# Patient Record
Sex: Male | Born: 1975 | Race: White | Hispanic: No | Marital: Married | Smoking: Never smoker
Health system: Southern US, Community
[De-identification: ages and names within clinical notes are randomized; demographics above are authoritative.]

## PROBLEM LIST (undated history)

## (undated) DIAGNOSIS — T7840XA Allergy, unspecified, initial encounter: Secondary | ICD-10-CM

## (undated) DIAGNOSIS — Z8619 Personal history of other infectious and parasitic diseases: Secondary | ICD-10-CM

## (undated) HISTORY — PX: SHOULDER SURGERY: SHX246

## (undated) HISTORY — DX: Allergy, unspecified, initial encounter: T78.40XA

## (undated) HISTORY — DX: Personal history of other infectious and parasitic diseases: Z86.19

---
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
## 2015-12-25 LAB — BASIC METABOLIC PANEL
BUN: 13 (ref 4–21)
Creatinine: 1.3 (ref 0.6–1.3)
Glucose: 77
POTASSIUM: 4 (ref 3.4–5.3)
Sodium: 139 (ref 137–147)

## 2015-12-25 LAB — TSH: TSH: 0.62 (ref 0.41–5.90)

## 2015-12-25 LAB — HEPATIC FUNCTION PANEL
ALK PHOS: 56 (ref 25–125)
ALT: 40 (ref 10–40)
AST: 26 (ref 14–40)
BILIRUBIN, TOTAL: 0.8

## 2015-12-25 LAB — CBC AND DIFFERENTIAL
HCT: 46 (ref 41–53)
HEMOGLOBIN: 15.2 (ref 13.5–17.5)
PLATELETS: 298 (ref 150–399)
WBC: 6.5

## 2015-12-25 LAB — HIV ANTIBODY (ROUTINE TESTING W REFLEX): HIV: NEGATIVE

## 2015-12-25 LAB — LIPID PANEL
CHOLESTEROL: 277 — AB (ref 0–200)
HDL: 57 (ref 35–70)
LDL Cholesterol: 182
Triglycerides: 188 — AB (ref 40–160)

## 2015-12-25 LAB — HEMOGLOBIN A1C: HEMOGLOBIN A1C: 5.4

## 2017-06-14 ENCOUNTER — Ambulatory Visit: Payer: Self-pay | Admitting: Family Medicine

## 2017-06-16 ENCOUNTER — Other Ambulatory Visit: Payer: Self-pay | Admitting: Orthopedic Surgery

## 2017-06-16 DIAGNOSIS — M25512 Pain in left shoulder: Secondary | ICD-10-CM

## 2017-06-16 DIAGNOSIS — R531 Weakness: Secondary | ICD-10-CM

## 2017-06-26 ENCOUNTER — Ambulatory Visit (INDEPENDENT_AMBULATORY_CARE_PROVIDER_SITE_OTHER): Payer: 59 | Admitting: Family Medicine

## 2017-06-26 ENCOUNTER — Encounter: Payer: Self-pay | Admitting: Family Medicine

## 2017-06-26 ENCOUNTER — Ambulatory Visit
Admission: RE | Admit: 2017-06-26 | Discharge: 2017-06-26 | Disposition: A | Discharge status: Home / Self Care | Payer: 59 | Source: Ambulatory Visit | Attending: Orthopedic Surgery | Admitting: Orthopedic Surgery

## 2017-06-26 VITALS — BP 126/78 | HR 87 | Temp 98.3°F | Ht 72.0 in | Wt 205.5 lb

## 2017-06-26 DIAGNOSIS — J309 Allergic rhinitis, unspecified: Secondary | ICD-10-CM

## 2017-06-26 DIAGNOSIS — Z23 Encounter for immunization: Secondary | ICD-10-CM

## 2017-06-26 DIAGNOSIS — Z8042 Family history of malignant neoplasm of prostate: Secondary | ICD-10-CM

## 2017-06-26 DIAGNOSIS — M25512 Pain in left shoulder: Secondary | ICD-10-CM

## 2017-06-26 DIAGNOSIS — R531 Weakness: Secondary | ICD-10-CM

## 2017-06-26 MED ORDER — LORATADINE-PSEUDOEPHEDRINE ER 10-240 MG PO TB24: 1.0000 | ORAL_TABLET | Freq: Every day | ORAL | 11 refills | Status: DC

## 2017-06-26 NOTE — Patient Instructions (Signed)
We will obtain records from her previous physician.  I sent in a prescription for your Claritin-D.  Please come back to see me in a few months for your annual physical.  Please also come back to see me if your cough flares up.  Take care, Dr. Jimmey RalphParker

## 2017-06-26 NOTE — Assessment & Plan Note (Signed)
Stable.  Continue Claritin-D.  Advised patient to follow-up when he has his flare up of asthma-like symptoms.  Will consider trial of albuterol and/or referral to pulmonology for PFTs.

## 2017-06-26 NOTE — Assessment & Plan Note (Signed)
Check PSA with next blood draw. 

## 2017-06-26 NOTE — Progress Notes (Signed)
Subjective:  Miguel MeresJeffrey Haberl is a 42 y.o. male who presents today with a chief complaint of allergic rhinitis and to establish care.   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  He has been living in [place] for approximately 5 years.  Previously was in [place], [place].  Originally from [place].  Allergic rhinitis, new problem Several year history.  Has been stable over the last several years since moving to [place].  Takes Claritin-D every day which helps with the symptoms.  He will occasionally get flareups 2-3 times yearly in which he has more asthma-like symptoms with some shortness of breath and cough.  Does not have either of those symptoms today.  He has never had lung function testing nor allergy testing in the past.  Symptoms are usually worse in the spring and fall.  No other obvious alleviating or aggravating factors.  ROS: Per HPI, otherwise a 10 point review of systems was performed and was negative  PMH:  The following were reviewed and entered/updated in epic: Past Medical History:  Diagnosis Date  . Allergy   . History of chicken pox    Patient Active Problem List   Diagnosis Date Noted  . Allergic rhinitis 06/26/2017  . Family history of prostate cancer 06/26/2017   Past Surgical History:  Procedure Laterality Date  . WISDOM TOOTH EXTRACTION  [id number]   Mother and MGM with ovarian cancer.  PGF with prostate cancer.   Medications- reviewed and updated Current Outpatient Medications  Medication Sig Dispense Refill  . loratadine-pseudoephedrine (CLARITIN-D 24 HOUR) 10-240 MG 24 hr tablet Take 1 tablet by mouth daily. 30 tablet 11   No current facility-administered medications for this visit.    Allergies-reviewed and updated No Known Allergies  Social History   Socioeconomic History  . Marital status: Married    Spouse name: None  . Number of children: 3  . Years of education: None  . Highest education level: None   Social Needs  . Financial resource strain: None  . Food insecurity - worry: None  . Food insecurity - inability: None  . Transportation needs - medical: None  . Transportation needs - non-medical: None  Occupational History  . None  Tobacco Use  . Smoking status: Never Smoker  . Smokeless tobacco: Never Used  Substance and Sexual Activity  . Alcohol use: Yes    Comment: 3-5 drinks wine an [place]bon  . Drug use: No  . Sexual activity: Yes  Other Topics Concern  . None  Social History Narrative  . None   Objective:  Physical Exam: BP 126/78 (BP Location: Left Arm, Patient Position: Sitting, Cuff Size: Large)   Pulse 87   Temp 98.3 F (36.8 C) (Oral)   Ht 6' (1.829 m)   Wt 205 lb 8 oz (93.2 kg)   SpO2 95%   BMI 27.87 kg/m   Gen: NAD, resting comfortably CV: RRR with no murmurs appreciated Pulm: NWOB, CTAB with no crackles, wheezes, or rhonchi GI: Normal bowel sounds present. Soft, Nontender, Nondistended. MSK: No edema, cyanosis, or clubbing noted Skin: Warm, dry Neuro: Grossly normal, moves all extremities Psych: Normal affect and thought content  Assessment/Plan:  Family history of prostate cancer Check PSA with next blood draw.  Allergic rhinitis Stable.  Continue Claritin-D.  Advised patient to follow-up when he has his flare up of asthma-like symptoms.  Will consider trial of albuterol and/or referral to pulmonology for PFTs.  Preventative healthcare Flu vaccine and tetanus  vaccine given today.  He will return soon for CPE.  Given family history, will need prostate cancer screening as well.   Katina Degree. Jimmey Ralph, MD 06/26/2017 11:01 AM

## 2017-07-13 ENCOUNTER — Encounter: Payer: Self-pay | Admitting: Family Medicine

## 2018-03-14 ENCOUNTER — Encounter: Payer: Self-pay | Admitting: Family Medicine

## 2018-03-14 ENCOUNTER — Ambulatory Visit (INDEPENDENT_AMBULATORY_CARE_PROVIDER_SITE_OTHER): Payer: 59 | Admitting: Family Medicine

## 2018-03-14 VITALS — BP 124/72 | HR 62 | Temp 98.0°F | Ht 72.0 in | Wt 196.6 lb

## 2018-03-14 DIAGNOSIS — R05 Cough: Secondary | ICD-10-CM | POA: Diagnosis not present

## 2018-03-14 DIAGNOSIS — R059 Cough, unspecified: Secondary | ICD-10-CM

## 2018-03-14 DIAGNOSIS — Z23 Encounter for immunization: Secondary | ICD-10-CM

## 2018-03-14 DIAGNOSIS — J342 Deviated nasal septum: Secondary | ICD-10-CM

## 2018-03-14 DIAGNOSIS — J309 Allergic rhinitis, unspecified: Secondary | ICD-10-CM | POA: Diagnosis not present

## 2018-03-14 MED ORDER — ALBUTEROL SULFATE HFA 108 (90 BASE) MCG/ACT IN AERS: 2.0000 | INHALATION_SPRAY | Freq: Four times a day (QID) | RESPIRATORY_TRACT | 2 refills | Status: DC | PRN

## 2018-03-14 MED ORDER — IPRATROPIUM BROMIDE 0.06 % NA SOLN: 2.0000 | Freq: Four times a day (QID) | NASAL | 12 refills | Status: DC

## 2018-03-14 MED ORDER — LORATADINE-PSEUDOEPHEDRINE ER 10-240 MG PO TB24: 1.0000 | ORAL_TABLET | Freq: Every day | ORAL | 3 refills | Status: DC

## 2018-03-14 NOTE — Assessment & Plan Note (Addendum)
Discussed treatment options including ENT referral.  No current symptoms.  Continue with watchful waiting.

## 2018-03-14 NOTE — Assessment & Plan Note (Signed)
Refilled Claritin today.  Start Atrovent nasal spray as well.  Likely contributing to his cough.

## 2018-03-14 NOTE — Patient Instructions (Addendum)
It was very nice to see you today!  I think your airways are reactive to allergens in the ear.  Start Atrovent nasal spray.  Please take this 3-4 times daily for runny nose and postnasal drip.  I will also send in a prescription for an albuterol inhaler.  Please take this as needed for wheezing or cough.  You can also use this about 30 minutes prior to starting any activity that makes her cough worse.  I will refill your Claritin today.  We will give your flu shot today.  Please let me know if your symptoms worsen or do not improve over the next 1-2 weeks.  Take care, Dr Jimmey Ralph

## 2018-03-14 NOTE — Progress Notes (Signed)
   Subjective:  Miguel Jordan is a 42 y.o. male who presents today for same-day appointment with a chief complaint of cough.   HPI:  Cough, Acute problem Started about 3 weeks ago. Stable over that time. No treatments tried.  He has a history of allergic rhinitis when he has inquired need for this.  Occasionally has shortness of breath.  Cough is nonproductive.  No wheezing.  He has had this recurrently over the last several years.  He has tried over-the-counter medications without significant improvement.  No chest pain.  He has been on Singulair in the past which did not help.  No heartburn or acid reflux.  He has been able to run and exercise normally.  No other obvious alleviating or aggravating factors.  ROS: Per HPI  PMH: He reports that he has never smoked. He has never used smokeless tobacco. He reports that he drinks alcohol. He reports that he does not use drugs.  Objective:  Physical Exam: BP 124/72 (BP Location: Left Arm, Patient Position: Sitting, Cuff Size: Normal)   Pulse 62   Temp 98 F (36.7 C) (Oral)   Ht 6' (1.829 m)   Wt 196 lb 9.6 oz (89.2 kg)   SpO2 96%   BMI 26.66 kg/m   Gen: NAD, resting comfortably HEENT: TMs with clear effusion bilaterally.  Oropharynx erythematous without exudate.  Nasal mucosa clear bilaterally with clear nasal discharge.  Leftward nasal septal deviation noted. CV: RRR with no murmurs appreciated Pulm: NWOB, CTAB with no crackles, wheezes, or rhonchi  Assessment/Plan:  Cough No red flag signs or symptoms.  Pulmonary exam negative.  Likely secondary to allergic rhinitis and reactive airway.  We will add Atrovent nasal spray to his current regimen.  Also start albuterol inhaler as he likely has a component of reactive airway.  Anticipate improvement as pollen season improves over the next few weeks.  Discussed reasons to return to care.  Follow-up as needed.  If symptoms persist or not improve over the next 2 weeks, would consider imaging  and/or referral to pulmonology for PFTs.  Nasal septal deviation Discussed treatment options including ENT referral.  No current symptoms.  Continue with watchful waiting.  Allergic rhinitis Refilled Claritin today.  Start Atrovent nasal spray as well.  Likely contributing to his cough.  Preventative Healthcare Flu shot declined.   Katina Degree. Jimmey Ralph, MD 03/14/2018 10:41 AM

## 2018-11-14 IMAGING — MR MR SHOULDER*L* W/O CM
4 of 7 series · 14 of 40 positions shown · non-contrast
Comparison: None.

CLINICAL DATA: Left shoulder pain for 7 months with limited range
of motion. No known injury.

EXAM:
MRI OF THE LEFT SHOULDER WITHOUT CONTRAST
TECHNIQUE: Multiplanar, multisequence MR imaging of the shoulder was performed.
No intravenous contrast was administered.

[Series 6: T2 fat-sat · axial · left · 3.0mm · 0.44mm/px · z∈[-53,+19]mm · 3 of 25 slices shown (1 of 2)]
[im 5/25]
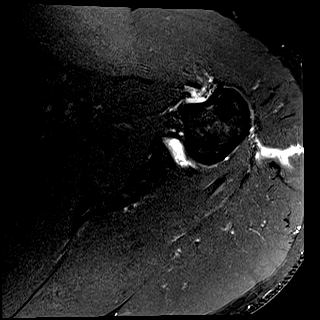
[im 15/25]
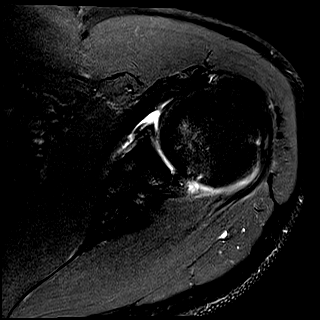
[im 25/25]
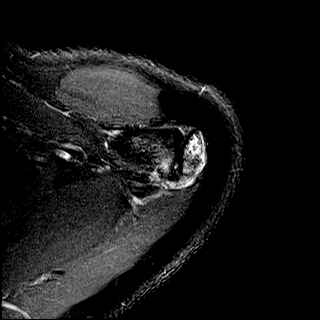

[Series 8: T2 fat-sat · oblique · left · 3.0mm · 0.36mm/px · 3 of 24 slices shown (2 of 2)]
[im 5/24]
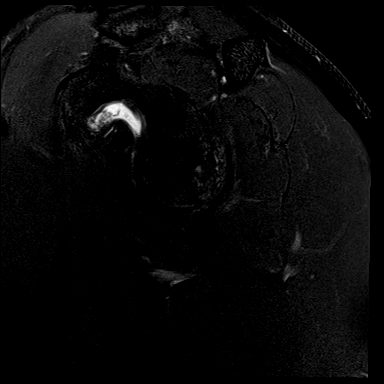
[im 14/24]
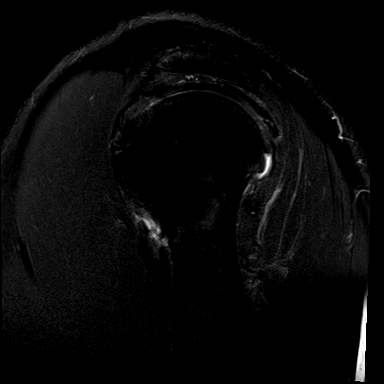
[im 24/24]
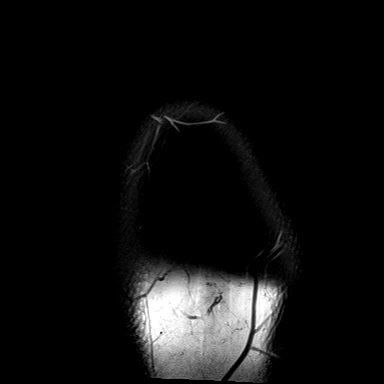

[Series 9: PD · oblique · left · 3.0mm · 0.18mm/px · 5 of 21 slices shown (1 of 2)]
[im 1/21]
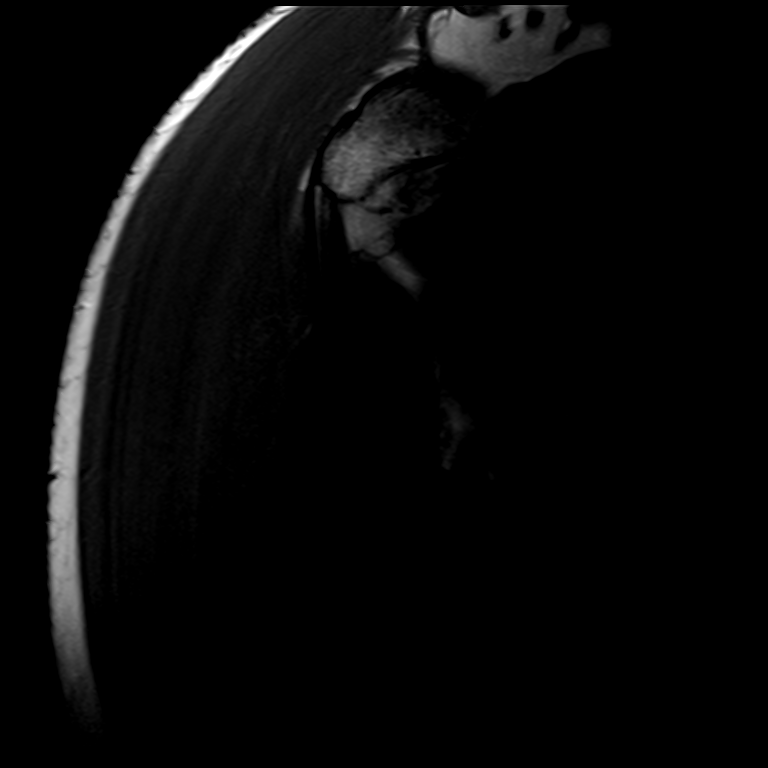
[im 6/21]
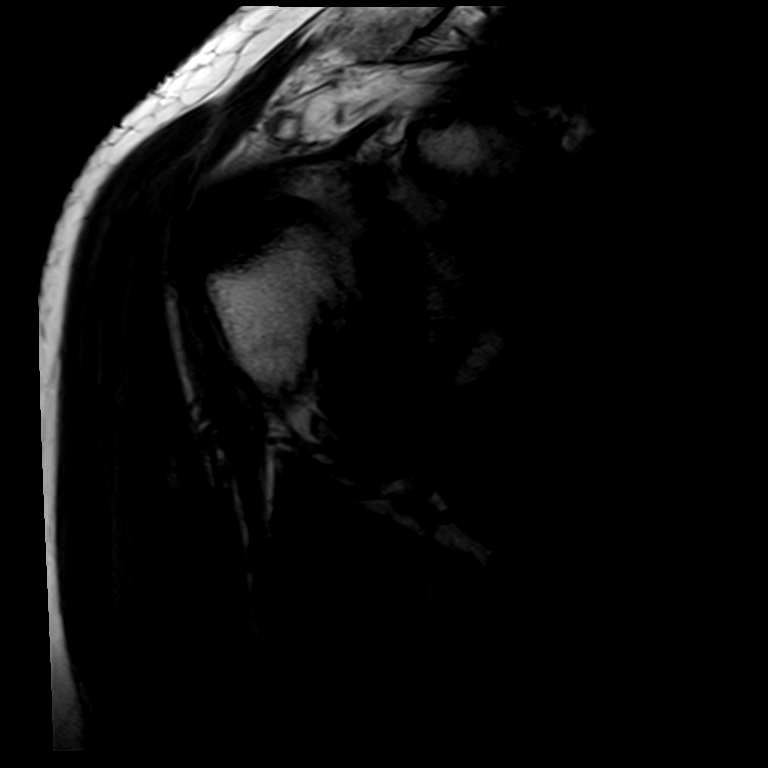
[im 11/21]
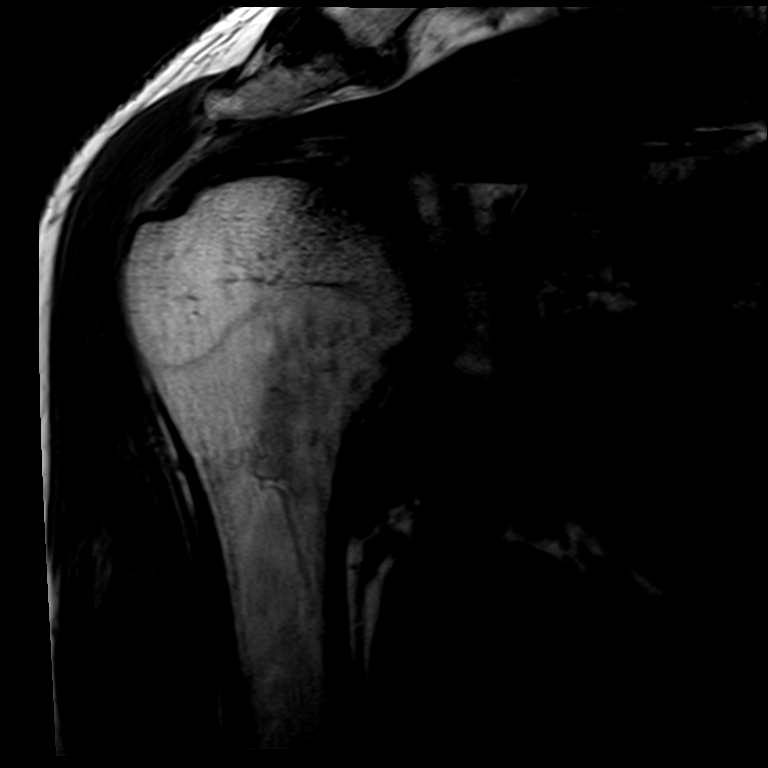
[im 16/21]
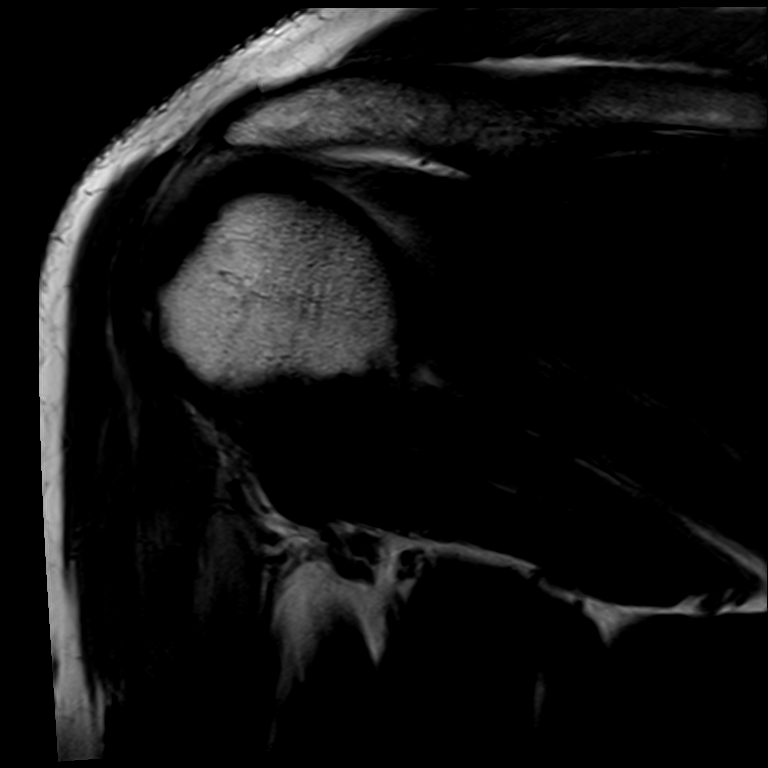
[im 21/21]
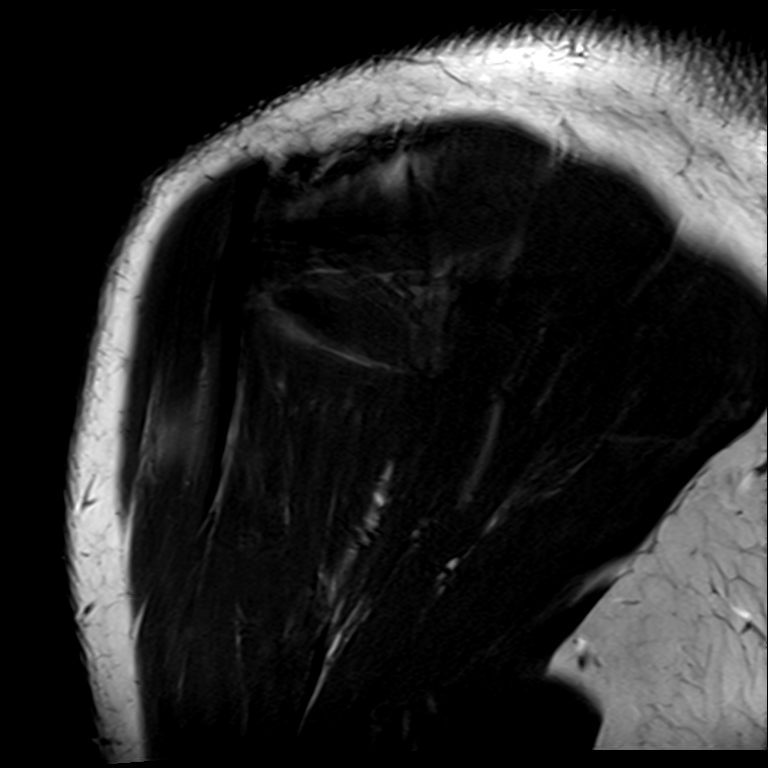

[Series 13: PD · axial · left · 3.0mm · 0.22mm/px · z∈[-53,+19]mm · 3 of 25 slices shown (2 of 2)]
[im 5/25]
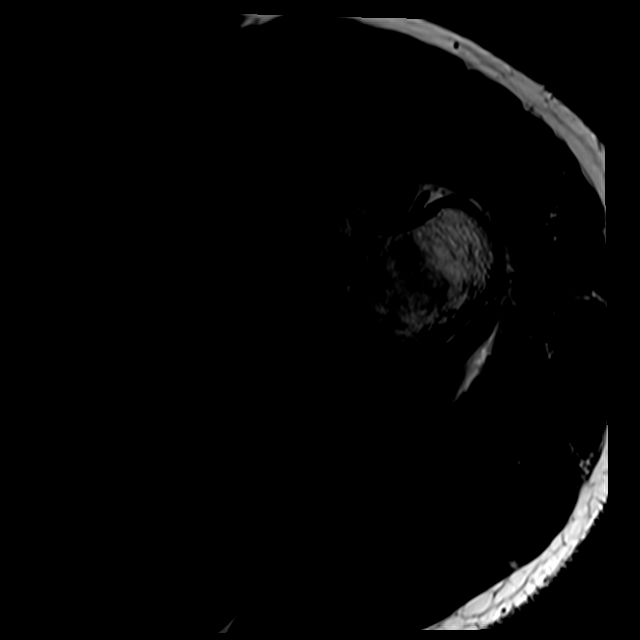
[im 15/25]
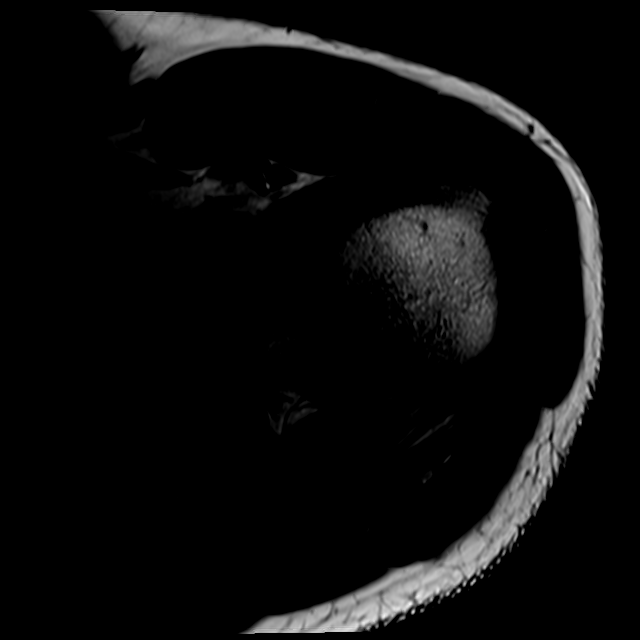
[im 25/25]
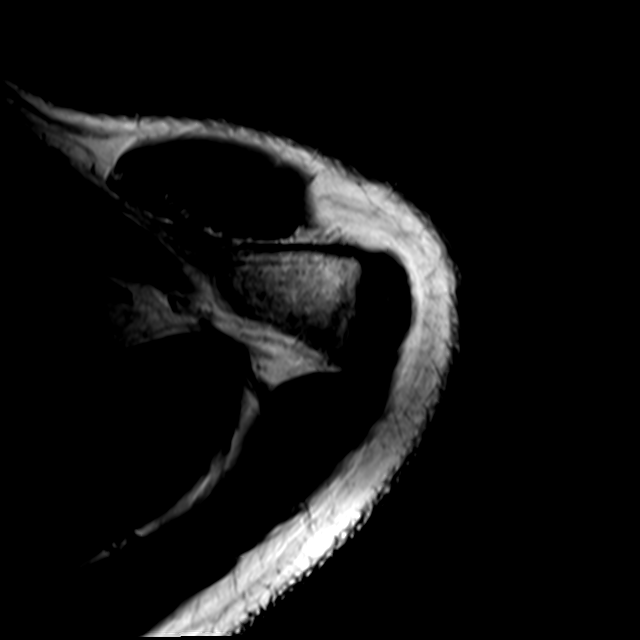

[14 of 40 positions shown; findings below may reference images not displayed]

FINDINGS: Rotator cuff: Mild appearing rotator cuff tendinopathy without tear
is seen.

Muscles:  Normal without atrophy or focal lesion.

Biceps long head: Intact. Tendinopathy of the intra-articular
segment is identified.

Acromioclavicular Joint: Bulky degenerative change is present with
marrow edema and subchondral cyst formation about the joint. Bony
hypertrophy results in some mass effect on the supraspinatus. Type 1
acromion. No subacromial/subdeltoid bursal fluid.

Glenohumeral Joint: Glenohumeral osteoarthritis appears advanced for
age. Mild subchondral edema and a few small cysts are seen in the
inferior and posterior glenoid. Cartilage is thinned throughout.
There is an osteophyte off the humeral head.

Labrum: A cyst is seen within the substance of the superior labrum
measuring 0.4 cm craniocaudal by 0.9 cm AP by 0.3 cm transverse.
There is tear of the posterior, superior labrum and remainder of the
posterior labrum is markedly degenerated and frayed.

Bones:  No fracture or worrisome lesion.

Other: None.
IMPRESSION: Advanced for age acromioclavicular osteoarthritis with marrow edema
and subchondral cyst formation about the joint. Bony hypertrophy
results in some mass effect on the supraspinatus.

Advanced for age glenohumeral osteoarthritis. A tear of the
posterior, superior labrum is identified. A degenerative cyst is
seen within the substance of the superior labrum.

Mild appearing rotator cuff tendinopathy without tear. Tendinopathy
of the intra-articular long head of biceps is also seen.

## 2018-12-19 ENCOUNTER — Ambulatory Visit (INDEPENDENT_AMBULATORY_CARE_PROVIDER_SITE_OTHER): Payer: 59 | Admitting: Family Medicine

## 2018-12-19 ENCOUNTER — Encounter: Payer: Self-pay | Admitting: Family Medicine

## 2018-12-19 ENCOUNTER — Other Ambulatory Visit: Payer: Self-pay

## 2018-12-19 VITALS — BP 128/80 | HR 75 | Temp 98.3°F | Resp 16 | Ht 72.0 in | Wt 195.4 lb

## 2018-12-19 DIAGNOSIS — J301 Allergic rhinitis due to pollen: Secondary | ICD-10-CM

## 2018-12-19 DIAGNOSIS — H6502 Acute serous otitis media, left ear: Secondary | ICD-10-CM

## 2018-12-19 DIAGNOSIS — H9312 Tinnitus, left ear: Secondary | ICD-10-CM

## 2018-12-19 MED ORDER — FLUTICASONE PROPIONATE 50 MCG/ACT NA SUSP: 1.0000 | Freq: Every day | NASAL | 6 refills | Status: DC

## 2018-12-19 NOTE — Patient Instructions (Signed)
Please return in 3-4 weeks with Dr. Jerline Pain for recheck.  Start flonase and change to zyrtec nightly.   If you have any questions or concerns, please don't hesitate to send me a message via MyChart or call the office at [phone]. Thank you for visiting with [place] today! It's our pleasure caring for you.   Tinnitus Tinnitus refers to hearing a sound when there is no actual source for that sound. This is often described as ringing in the ears. However, people with this condition may hear a variety of noises, in one ear or in both ears. The sounds of tinnitus can be soft, loud, or somewhere in between. Tinnitus can last for a few seconds or can be constant for days. It may go away without treatment and come back at various times. When tinnitus is constant or happens often, it can lead to other problems, such as trouble sleeping and trouble concentrating. Almost everyone experiences tinnitus at some point. Tinnitus that is long-lasting (chronic) or comes back often (recurs) may require medical attention. What are the causes? The cause of tinnitus is often not known. In some cases, it can result from other problems or conditions, including:  Exposure to loud noises from machinery, music, or other sources.  Hearing loss.  Ear or sinus infections.  Earwax buildup.  An object (foreign body) stuck in the ear.  Taking certain medicines.  Drinking alcohol or caffeine.  High blood pressure.  Heart diseases.  Anemia.  Allergies.  Meniere's disease.  Thyroid problems.  Tumors.  A weak, bulging blood vessel (aneurysm) near the ear.  Depression or other mood disorders. What are the signs or symptoms? The main symptom of tinnitus is hearing a sound when there is no source for that sound. It may sound like:  Buzzing.  Roaring.  Ringing.  Blowing air, like the sound heard when you listen to a seashell.  Hissing.  Whistling.  Sizzling.  Humming.  Running water.  A  musical note.  Tapping. Symptoms may affect only one ear (unilateral) or both ears (bilateral). How is this diagnosed? Tinnitus is diagnosed based on your symptoms, your medical history, and a physical exam. Your health care provider may do a thorough hearing test (audiologic exam) if your tinnitus:  Is unilateral.  Causes hearing difficulties.  Lasts 6 months or longer. You may work with a health care provider who specializes in hearing disorders (audiologist). You may be asked questions about your symptoms and how they affect your daily life. You may have other tests done, such as:  CT scan.  MRI.  An imaging test of how blood flows through your blood vessels (angiogram). How is this treated? Treating an underlying medical condition can sometimes make tinnitus go away. If your tinnitus continues, other treatments may include:  Medicines, such as antidepressants or sleeping aids.  Sound generators to mask the tinnitus. These include: ? Tabletop sound machines that play relaxing sounds to help you fall asleep. ? Wearable devices that fit in your ear and play sounds or music. ? Acoustic neural stimulation. This involves using headphones to listen to music that contains an auditory signal. Over time, listening to this signal may change some pathways in your brain and make you less sensitive to tinnitus. This treatment is used for very severe cases when no other treatment is working.  Therapy and counseling to help you manage the stress of living with tinnitus.  Using hearing aids or cochlear implants if your tinnitus is related to hearing loss.  Hearing aids are worn in the outer ear. Cochlear implants are surgically placed in the inner ear. Follow these instructions at home: Managing symptoms      When possible, avoid being in loud places and being exposed to loud sounds.  Wear hearing protection, such as earplugs, when you are exposed to loud noises.  Use a white noise  machine, a humidifier, or other devices to mask the sound of tinnitus.  Practice techniques for reducing stress, such as meditation, yoga, or deep breathing. Work with your health care provider if you need help with managing stress.  Sleep with your head slightly raised. This may reduce the impact of tinnitus. General instructions  Do not use stimulants, such as nicotine, alcohol, or caffeine. Talk with your health care provider about other stimulants to avoid. Stimulants are substances that can make you feel alert and attentive by increasing certain activities in the body (such as heart rate and blood pressure). These substances may make tinnitus worse.  Take over-the-counter and prescription medicines only as told by your health care provider.  Try to get plenty of sleep each night.  Keep all follow-up visits as told by your health care provider. This is important. Contact a health care provider if:  Your tinnitus continues for 3 weeks or longer without stopping.  Your symptoms get worse or do not get better with home care.  You develop tinnitus after a head injury.  You have tinnitus along with any of the following: ? Dizziness. ? Loss of balance. ? Nausea and vomiting. Summary  Tinnitus refers to hearing a sound when there is no actual source for that sound. This is often described as ringing in the ears.  Symptoms may affect only one ear (unilateral) or both ears (bilateral).  Use a white noise machine, a humidifier, or other devices to mask the sound of tinnitus.  Do not use stimulants, such as nicotine, alcohol, or caffeine. Talk with your health care provider about other stimulants to avoid. These substances may make tinnitus worse. This information is not intended to replace advice given to you by your health care provider. Make sure you discuss any questions you have with your health care provider. Document Released: 05/02/2005 Document Revised: 04/14/2017 Document  Reviewed: 02/09/2017 Elsevier Patient Education  2020 [employer]

## 2018-12-19 NOTE — Progress Notes (Signed)
Subjective  CC:  Chief Complaint  Patient presents with  . Tinnitus    Started 1 week ago in L ear.. States that it is constant, feeling pressure, and muffled hearing    HPI: Miguel Jordan is a 43 y.o. male who presents to the office today to address the problems listed above in the chief complaint.  Very pleasant and healthy 43 yo with SAR presents to left ear sxs: pressure and ringing. Started abruptly after running: went down to do pushups and pressure sensation and ringing started. Pressure now comes and goes, clears with valsalve, but ringing persists. Denies hearing loss or pain. No former trauma. First episode. Takes claritin d for allergies but admits sxs have been more active over the last month. No sinus pain, f/c/s. Feels well otherwise.    Assessment  1. Tinnitus, left   2. Acute serous otitis media of left ear, recurrence not specified   3. Seasonal allergic rhinitis due to pollen      Plan   Tinnitus, likely due to serous otitis due to active SAR:  Education given. Change to zyrtec, and add flonase. Recheck in 3-4 weeks if not improving.   Follow up: Return in about 3 weeks (around 01/09/2019) for recheck tinnitus with Dr. Jimmey RalphParker.  Visit date not found  No orders of the defined types were placed in this encounter.  Meds ordered this encounter  Medications  . fluticasone (FLONASE) 50 MCG/ACT nasal spray    Sig: Place 1 spray into both nostrils daily.    Dispense:  16 g    Refill:  6      I reviewed the patients updated PMH, FH, and SocHx.    Patient Active Problem List   Diagnosis Date Noted  . Nasal septal deviation 03/14/2018  . Allergic rhinitis 06/26/2017  . Family history of prostate cancer 06/26/2017   Current Meds  Medication Sig  . albuterol (PROVENTIL HFA;VENTOLIN HFA) 108 (90 Base) MCG/ACT inhaler Inhale 2 puffs into the lungs every 6 (six) hours as needed for wheezing or shortness of breath (cough).  Marland Kitchen. ipratropium (ATROVENT) 0.06 % nasal  spray Place 2 sprays into both nostrils 4 (four) times daily.  Marland Kitchen. loratadine-pseudoephedrine (CLARITIN-D 24 HOUR) 10-240 MG 24 hr tablet Take 1 tablet by mouth daily.    Allergies: Patient has No Known Allergies. Family History: Patient family history includes Prostate cancer in his paternal grandfather. Social History:  Patient  reports that he has never smoked. He has never used smokeless tobacco. He reports current alcohol use. He reports that he does not use drugs.  Review of Systems: Constitutional: Negative for fever malaise or anorexia Cardiovascular: negative for chest pain Respiratory: negative for SOB or persistent cough Gastrointestinal: negative for abdominal pain  Objective  Vitals: BP 128/80   Pulse 75   Temp 98.3 F (36.8 C) (Tympanic)   Resp 16   Ht 6' (1.829 m)   Wt 195 lb 6.4 oz (88.6 kg)   SpO2 95%   BMI 26.50 kg/m  General: no acute distress , A&Ox3 HEENT: PEERL, conjunctiva normal, Oropharynx moist,neck is supple, bilateral serous effusions, nl landmarks, no cerumen, nl EACs Cardiovascular:  RRR without murmur or gallop.  Respiratory:  Good breath sounds bilaterally, CTAB with normal respiratory effort Skin:  Warm, no rashes   Hearing Screening   Method: Audiometry   125Hz  250Hz  500Hz  1000Hz  2000Hz  3000Hz  4000Hz  6000Hz  8000Hz   Right ear:           Left ear:  Comments: Pass    Commons side effects, risks, benefits, and alternatives for medications and treatment plan prescribed today were discussed, and the patient expressed understanding of the given instructions. Patient is instructed to call or message via MyChart if he/she has any questions or concerns regarding our treatment plan. No barriers to understanding were identified. We discussed Red Flag symptoms and signs in detail. Patient expressed understanding regarding what to do in case of urgent or emergency type symptoms.   Medication list was reconciled, printed and provided to the  patient in AVS. Patient instructions and summary information was reviewed with the patient as documented in the AVS. This note was prepared with assistance of Dragon voice recognition software. Occasional wrong-word or sound-a-like substitutions may have occurred due to the inherent limitations of voice recognition software

## 2018-12-24 ENCOUNTER — Other Ambulatory Visit: Payer: Self-pay

## 2018-12-24 MED ORDER — CETIRIZINE HCL 10 MG PO TABS: 10.0000 mg | ORAL_TABLET | Freq: Every day | ORAL | 0 refills | Status: DC

## 2019-03-05 ENCOUNTER — Encounter: Payer: Self-pay | Admitting: Family Medicine

## 2019-03-05 ENCOUNTER — Ambulatory Visit (INDEPENDENT_AMBULATORY_CARE_PROVIDER_SITE_OTHER): Payer: BC Managed Care – PPO | Admitting: Family Medicine

## 2019-03-05 DIAGNOSIS — J301 Allergic rhinitis due to pollen: Secondary | ICD-10-CM

## 2019-03-05 DIAGNOSIS — J4520 Mild intermittent asthma, uncomplicated: Secondary | ICD-10-CM

## 2019-03-05 DIAGNOSIS — H9312 Tinnitus, left ear: Secondary | ICD-10-CM

## 2019-03-05 MED ORDER — PREDNISONE 10 MG PO TABS: ORAL_TABLET | ORAL | 0 refills | Status: DC

## 2019-03-05 MED ORDER — ALBUTEROL SULFATE HFA 108 (90 BASE) MCG/ACT IN AERS: 2.0000 | INHALATION_SPRAY | Freq: Four times a day (QID) | RESPIRATORY_TRACT | 2 refills | Status: DC | PRN

## 2019-03-05 NOTE — Progress Notes (Signed)
Virtual Visit via Video Note  Subjective  CC:  Chief Complaint  Patient presents with   Allergic Rhinitis     Started about 1 week ago.. Reports cough, left ear drainage and ringing, and congestion.. Has tried Zyrtec     I connected with Arvilla Meres on 03/05/19 at  8:20 AM EDT by a video enabled telemedicine application and verified that I am speaking with the correct person using two identifiers. Location patient: Home Location provider: [facility] [address], Office Persons participating in the virtual visit: Efton Thomley, Willow Ora, MD Rita Ohara, CMA  I discussed the limitations of evaluation and management by telemedicine and the availability of in person appointments. The patient expressed understanding and agreed to proceed. HPI: Miguel Jordan is a 43 y.o. male who was contacted today to address the problems listed above in the chief complaint.  43 yo with moderate allergies with annual "fall flare": has active allergies with nasal congestion and mild PND, and cough triggered by exhalation. Denies tightness in chest or SOB, productive cough, f/c/s, malaise, sinus pressure or pain but cough is bothersome. No h/o asthma or copd. Had similar presentation last fall: treated with albuterol MDI, atrovent nasal spray and allergy meds. Is on flonase and zyrtec now. Ran out of albuterol, feels it helps. Has not yet been to allergist or ENT. Of note, the tinnitus that started back in august persists: his "clogged" ear has resolved though. No ear pain.   Assessment  1. Allergic asthma, mild intermittent, uncomplicated   2. Seasonal allergic rhinitis due to pollen   3. Tinnitus, left      Plan   Allergic bronchospasm:  Refill albuterol and give short pred taper. Continue allergy meds.   Allergies: consider allergist referral if can't get better controlled.   Tinnitus: suspect due to allergies.  I discussed the assessment and treatment plan with  the patient. The patient was provided an opportunity to ask questions and all were answered. The patient agreed with the plan and demonstrated an understanding of the instructions.   The patient was advised to call back or seek an in-person evaluation if the symptoms worsen or if the condition fails to improve as anticipated. Follow up: No follow-ups on file.  Visit date not found  Meds ordered this encounter  Medications   albuterol (VENTOLIN HFA) 108 (90 Base) MCG/ACT inhaler    Sig: Inhale 2 puffs into the lungs every 6 (six) hours as needed for wheezing or shortness of breath (cough).    Dispense:  18 g    Refill:  2   predniSONE (DELTASONE) 10 MG tablet    Sig: Take 4 tabs qd x 2 days, 3 qd x 2 days, 2 qd x 2d, 1qd x 3 days    Dispense:  21 tablet    Refill:  0      I reviewed the patients updated PMH, FH, and SocHx.    Patient Active Problem List   Diagnosis Date Noted   Tinnitus, left 03/05/2019   Nasal septal deviation 03/14/2018   Allergic rhinitis 06/26/2017   Family history of prostate cancer 06/26/2017   Current Meds  Medication Sig   cetirizine (ZYRTEC) 10 MG tablet Take 1 tablet (10 mg total) by mouth daily.    Allergies: Patient has No Known Allergies. Family History: Patient family history includes Prostate cancer in his paternal grandfather. Social History:  Patient  reports that he has never smoked. He has  never used smokeless tobacco. He reports current alcohol use. He reports that he does not use drugs.  Review of Systems: Constitutional: Negative for fever malaise or anorexia Cardiovascular: negative for chest pain Respiratory: negative for SOB or persistent cough Gastrointestinal: negative for abdominal pain  OBJECTIVE Vitals: There were no vitals taken for this visit. General: no acute distress , A&Ox3, some coughing triggered with breaths. No respiratory distress Nasal congestion present. Appears well  Leamon Arnt, MD

## 2019-04-03 ENCOUNTER — Telehealth: Payer: Self-pay | Admitting: Family Medicine

## 2019-04-08 ENCOUNTER — Other Ambulatory Visit: Payer: Self-pay

## 2019-04-08 MED ORDER — FLUTICASONE PROPIONATE 50 MCG/ACT NA SUSP: 1.0000 | Freq: Every day | NASAL | 6 refills | Status: DC

## 2019-04-08 MED ORDER — CETIRIZINE HCL 10 MG PO TABS: 10.0000 mg | ORAL_TABLET | Freq: Every day | ORAL | 3 refills | Status: DC

## 2019-04-08 NOTE — Telephone Encounter (Signed)
Pt called in and stated the flonase is ok to refill and not this nasal spray.  He also would like to get the zyrtec refilled.   Pharmacy - [facility] in [place]

## 2019-04-08 NOTE — Telephone Encounter (Signed)
See note

## 2019-04-08 NOTE — Telephone Encounter (Signed)
Rx sent- patient aware.  

## 2019-05-24 ENCOUNTER — Encounter: Payer: Self-pay | Admitting: Family Medicine

## 2019-05-24 ENCOUNTER — Other Ambulatory Visit: Payer: Self-pay

## 2019-05-24 ENCOUNTER — Ambulatory Visit (INDEPENDENT_AMBULATORY_CARE_PROVIDER_SITE_OTHER): Payer: BC Managed Care – PPO | Admitting: Family Medicine

## 2019-05-24 VITALS — BP 110/70 | HR 72 | Temp 98.2°F | Ht 72.0 in | Wt 204.2 lb

## 2019-05-24 DIAGNOSIS — J301 Allergic rhinitis due to pollen: Secondary | ICD-10-CM

## 2019-05-24 DIAGNOSIS — H1010 Acute atopic conjunctivitis, unspecified eye: Secondary | ICD-10-CM | POA: Diagnosis not present

## 2019-05-24 MED ORDER — PREDNISONE 50 MG PO TABS: ORAL_TABLET | ORAL | 0 refills | Status: DC

## 2019-05-24 NOTE — Assessment & Plan Note (Addendum)
Worsened. With conjunctivitis and middle ear effusion today. No red flags. Will start prednisone burst.  He will continue zyrtex and flonase. Will place referral to allergy for further management/evalutation.   Recommended pataday for his conjunctivitis as well.

## 2019-05-24 NOTE — Patient Instructions (Signed)
It was very nice to see you today!  Please start the prednisone.  Try the over the counter pataday drops.  I will send in a referral for you to see the allergist.  Come back in the summer for your physical, or sooner if needed.   Take care, Dr Jimmey Ralph  Please try these tips to maintain a healthy lifestyle:   Eat at least 3 REAL meals and 1-2 snacks per day.  Aim for no more than 5 hours between eating.  If you eat breakfast, please do so within one hour of getting up.    Each meal should contain half fruits/vegetables, one quarter protein, and one quarter carbs (no bigger than a computer mouse)   Cut down on sweet beverages. This includes juice, soda, and sweet tea.     Drink at least 1 glass of water with each meal and aim for at least 8 glasses per day   Exercise at least 150 minutes every week.

## 2019-05-24 NOTE — Progress Notes (Signed)
   Miguel Jordan is a 44 y.o. male who presents today for an office visit.  Assessment/Plan:  Chronic Problems Addressed Today: Allergic rhinitis Worsened. With conjunctivitis and middle ear effusion today. No red flags. Will start prednisone burst.  He will continue zyrtex and flonase. Will place referral to allergy for further management/evalutation.   Recommended pataday for his conjunctivitis as well.      Subjective:  HPI:  Left Eye Swelling Started 2-3 days ago. Stable today, but was getting worse. Vision is normal. Feels like eye is swollen. Pain located under his eye.  Thinks he may be having a flareup of his allergies.  Is also having some persistent ringing in his left ear.  Hearing is normal.  Has had issues with allergies for several years.       Objective:  Physical Exam: BP 110/70   Pulse 72   Temp 98.2 F (36.8 C)   Ht 6' (1.829 m)   Wt 204 lb 4 oz (92.6 kg)   SpO2 98%   BMI 27.70 kg/m   Gen: No acute distress, resting comfortably HEENT: Bilateral TMs with clear effusion.  Left eye with normal extraocular eye movements.  Conjunctival erythema and watery discharge noted. CV: Regular rate and rhythm with no murmurs appreciated Pulm: Normal work of breathing, clear to auscultation bilaterally with no crackles, wheezes, or rhonchi Neuro: Grossly normal, moves all extremities Psych: Normal affect and thought content      Miguel Jordan M. Jimmey Ralph, MD 05/24/2019 3:29 PM

## 2019-05-24 NOTE — Progress Notes (Deleted)
Error

## 2019-06-21 ENCOUNTER — Encounter: Payer: Self-pay | Admitting: Allergy

## 2019-06-21 ENCOUNTER — Other Ambulatory Visit: Payer: Self-pay

## 2019-06-21 ENCOUNTER — Telehealth: Payer: Self-pay

## 2019-06-21 ENCOUNTER — Ambulatory Visit (INDEPENDENT_AMBULATORY_CARE_PROVIDER_SITE_OTHER): Payer: BC Managed Care – PPO | Admitting: Allergy

## 2019-06-21 VITALS — BP 124/90 | HR 60 | Temp 97.9°F | Resp 16 | Ht 72.5 in | Wt 203.8 lb

## 2019-06-21 DIAGNOSIS — J3089 Other allergic rhinitis: Secondary | ICD-10-CM

## 2019-06-21 DIAGNOSIS — R05 Cough: Secondary | ICD-10-CM

## 2019-06-21 DIAGNOSIS — H1013 Acute atopic conjunctivitis, bilateral: Secondary | ICD-10-CM

## 2019-06-21 DIAGNOSIS — R058 Other specified cough: Secondary | ICD-10-CM

## 2019-06-21 MED ORDER — MONTELUKAST SODIUM 10 MG PO TABS: ORAL_TABLET | ORAL | 5 refills | Status: DC

## 2019-06-21 MED ORDER — AZELASTINE-FLUTICASONE 137-50 MCG/ACT NA SUSP: NASAL | 5 refills | Status: DC

## 2019-06-21 MED ORDER — LEVOCETIRIZINE DIHYDROCHLORIDE 5 MG PO TABS: ORAL_TABLET | ORAL | 5 refills | Status: DC

## 2019-06-21 NOTE — Telephone Encounter (Signed)
Fax from [employer]:  Dymista not covered Pt has tried and failed Flonase alone   PA has been submitted through cover my meds Waiting approval or denial  Your information has been submitted to [employer]. [employer] will review the request and fax you a determination directly, typically within 3 business days of your submission once all necessary information is received.  If [employer] has not responded in 3 business days or if you have any questions about your submission, contact [employer] [place] at [phone].

## 2019-06-21 NOTE — Progress Notes (Signed)
New Patient Note  RE: Miguel Jordan MRN: [id number] DOB: 11-Oct-1975 Date of Office Visit: 06/21/2019  Referring provider: Vivi Barrack, MD Primary care provider: Vivi Barrack, MD  Chief Complaint: Allergies  History of present illness: Miguel Jordan is a 44 y.o. male presenting today for consultation for allergic rhinitis.  He reports symptoms of itchy eyes, nasal congestion/drainage, sneezing, ear fullness/pressure.   Fall is the worst season for him but symptoms are year-round.  He has been dealing with allergy symptoms "all of his of life".  He states during the fall he normally will also develop a cough that is a dry hacking cough and SOB.  He has been prescribed an albuterol inhaler that he uses during this time.  He also states he normally will end up on prednisone to help with the symptoms.  He saw his primary care Dr. Jerline Pain on 05/24/2019 for allergic rhinitis and was treated with a prednisone burst.  At this visit he had conjunctivitis as well as a middle ear effusion.  He has been recommended to take Zyrtec, Flonase and Pataday.   He states he alternates between Allegra and Zyrtec and he does feel both of them to be effective.  He states he has tried Claritin as well in the past.  He states he just got back on Flonase this past fall and uses 1 spray each nostril daily.  He states he did use Pataday but only used it about 1 or 2 times and he states he was using it to treat a stye.   He was on Singulair about 10 years ago when he was living in [place].  He states he has struggled with allergies and all the locations he has lived in throughout the [place] and [place].  He has never had any allergy testing.  No history of eczema or food allergy.    Review of systems: Review of Systems  Constitutional: Negative.   HENT: Positive for congestion, ear pain and tinnitus. Negative for ear discharge and nosebleeds.   Eyes: Negative.   Respiratory: Positive for cough.    Cardiovascular: Negative.   Gastrointestinal: Negative.   Musculoskeletal: Negative.   Skin: Negative.   Neurological: Negative.     All other systems negative unless noted above in HPI  Past medical history: Past Medical History:  Diagnosis Date  . Allergy   . History of chicken pox     Past surgical history: Past Surgical History:  Procedure Laterality Date  . SHOULDER SURGERY    . WISDOM TOOTH EXTRACTION  1993    Family history:  Family History  Problem Relation Age of Onset  . Prostate cancer Paternal Grandfather   . Colon cancer Neg Hx     Social history: He lives in a home with carpeting in the bedroom allergic heating and central cooling.  There is a dog in the home.  There is no concern for water damage, mildew or roaches in the home.  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  Denies a smoking history.  Medication List: Current Outpatient Medications  Medication Sig Dispense Refill  . albuterol (VENTOLIN HFA) 108 (90 Base) MCG/ACT inhaler Inhale 2 puffs into the lungs every 6 (six) hours as needed for wheezing or shortness of breath (cough). 18 g 2  . cetirizine (ZYRTEC) 10 MG tablet Take 1 tablet (10 mg total) by mouth daily. 90 tablet 3  . fluticasone (FLONASE) 50 MCG/ACT  nasal spray Place 1 spray into both nostrils daily. 16 g 6  . Azelastine-Fluticasone 137-50 MCG/ACT SUSP Use 1 spray in each nostril twice daily 23 g 5  . levocetirizine (XYZAL) 5 MG tablet Take 1 tablet by mouth once daily 30 tablet 5  . montelukast (SINGULAIR) 10 MG tablet Take 1 tablet by mouth once daily 30 tablet 5   No current facility-administered medications for this visit.    Known medication allergies: No Known Allergies   Physical examination: Blood pressure 124/90, pulse 60, temperature 97.9 F (36.6 C), temperature source Temporal, resp. rate 16, height 6' 0.5" (1.842 m), weight 203 lb 12.8 oz (92.4 kg), SpO2 95  %.  General: Alert, interactive, in no acute distress. HEENT: PERRLA, TMs pearly gray, turbinates moderately edematous without discharge, post-pharynx non erythematous. Neck: Supple without lymphadenopathy. Lungs: Clear to auscultation without wheezing, rhonchi or rales. {no increased work of breathing. CV: Normal S1, S2 without murmurs. Abdomen: Nondistended, nontender. Skin: Warm and dry, without lesions or rashes. Extremities:  No clubbing, cyanosis or edema. Neuro:   Grossly intact.  Diagnositics/Labs:  Allergy testing: Environmental allergy skin prick testing is positive to grass pollens pollens, tree pollens, molds, dust mites, cat, dog and tobacco leaf. Allergy testing results were read and interpreted by provider, documented by clinical staff.   Assessment and plan: Patient Instructions  Environmental allergies -Environmental allergy skin prick testing is positive to grasses, weeds, trees, mold, dust mites, cat, dog and tobacco leaf -Allergen avoidance measures discussed/handouts provided -Continue a long-acting antihistamine daily -recommend use of either Allegra 180 mg, Zyrtec 10 mg or Xyzal 5 mg -Restart Singulair 10 mg daily at bedtime.  Singulair is not an antihistamine but can work alongside your antihistamine to help with allergy and respiratory symptom control. If you notice any change in mood/behavior/sleep after starting Singulair then stop this medication and let us know.  Symptoms resolve after stopping the medication.   -Recommend using nasal saline rinse to help keep the nose flushed and clean.  Perform nasal saline rinses with distilled water or boiled water and bring down to room temperature before use.  Use your medicated nasal sprays after performing saline rinse.  Saline rinse kit provided today. -For control of nasal congestion and drainage recommend use of generic Dymista 1 spray each nostril twice a day.  This is a combination nasal spray with Flonase + Astelin  (nasal antihistamine).  Flonase helps with congestion in the Astelin helps with drainage control. -For itchy/watery/red eyes can use over-the-counter Pataday 1 drop each eye daily as needed -allergen immunotherapy discussed today including protocol, benefits and risk.  Informational handout provided.  If interested in this therapuetic option you can check with your insurance carrier for coverage.  Let us know if you would like to proceed with this option.    Allergic cough -Allergy symptom control above will help to control respiratory symptoms -have access to albuterol inhaler (i.e. Ventolin) 2 puffs every 4-6 hours as needed for cough/wheeze/shortness of breath/chest tightness.  May use 15-20 minutes prior to activity.   Monitor frequency of use.   -Singulair as above  Control goals:   Full participation in all desired activities (may need albuterol before activity)  Albuterol use two time or less a week on average (not counting use with activity)  Cough interfering with sleep two time or less a month  Oral steroids no more than once a year  No hospitalizations  Follow-up in 4 months or sooner if needed  I appreciate  the opportunity to take part in Miguel Jordan's care. Please do not hesitate to contact me with questions.  Sincerely,   Prudy Feeler, MD Allergy/Immunology [facility]

## 2019-06-21 NOTE — Patient Instructions (Addendum)
Environmental allergies -Environmental allergy skin prick testing is positive to grasses, weeds, trees, mold, dust mites, cat, dog and tobacco leaf -Allergen avoidance measures discussed/handouts provided -Continue a long-acting antihistamine daily -recommend use of either Allegra 180 mg, Zyrtec 10 mg or Xyzal 5 mg -Restart Singulair 10 mg daily at bedtime.  Singulair is not an antihistamine but can work alongside your antihistamine to help with allergy and respiratory symptom control. If you notice any change in mood/behavior/sleep after starting Singulair then stop this medication and let us know.  Symptoms resolve after stopping the medication.   -Recommend using nasal saline rinse to help keep the nose flushed and clean.  Perform nasal saline rinses with distilled water or boiled water and bring down to room temperature before use.  Use your medicated nasal sprays after performing saline rinse.  Saline rinse kit provided today. -For control of nasal congestion and drainage recommend use of generic Dymista 1 spray each nostril twice a day.  This is a combination nasal spray with Flonase + Astelin (nasal antihistamine).  Flonase helps with congestion in the Astelin helps with drainage control. -For itchy/watery/red eyes can use over-the-counter Pataday 1 drop each eye daily as needed -allergen immunotherapy discussed today including protocol, benefits and risk.  Informational handout provided.  If interested in this therapuetic option you can check with your insurance carrier for coverage.  Let us know if you would like to proceed with this option.    Allergic cough -Allergy symptom control above will help to control respiratory symptoms -have access to albuterol inhaler (i.e. Ventolin) 2 puffs every 4-6 hours as needed for cough/wheeze/shortness of breath/chest tightness.  May use 15-20 minutes prior to activity.   Monitor frequency of use.   -Singulair as above  Control goals:   Full  participation in all desired activities (may need albuterol before activity)  Albuterol use two time or less a week on average (not counting use with activity)  Cough interfering with sleep two time or less a month  Oral steroids no more than once a year  No hospitalizations  Follow-up in 4 months or sooner if needed 

## 2019-06-24 NOTE — Telephone Encounter (Signed)
Dymista has been denied by insurance. It states that Azelastine and Flunisolide are preferred. Please advise change in nasal spray.

## 2019-06-25 MED ORDER — FLUNISOLIDE 25 MCG/ACT (0.025%) NA SOLN: 2.0000 | Freq: Two times a day (BID) | NASAL | 5 refills | Status: DC

## 2019-06-25 MED ORDER — AZELASTINE HCL 0.15 % NA SOLN: 2.0000 | Freq: Two times a day (BID) | NASAL | 5 refills | Status: DC

## 2019-06-25 NOTE — Telephone Encounter (Signed)
Left a detail message regarding medication change from Dymista to Azelastine and Flunisolide medication. Patient can call back to discuss the changes or contact the pharmacy.

## 2019-06-25 NOTE — Addendum Note (Signed)
Addended by: Osa Craver on: 06/25/2019 09:37 AM   Modules accepted: Orders

## 2019-06-25 NOTE — Telephone Encounter (Signed)
Ok.  Those are both appropriate thus can go ahead and order the Azelastine 2 sprays each nostril twice a daily prn for drainage control and Flunisolide 2 sprays each nostril daily prn for congestion control

## 2019-10-21 ENCOUNTER — Ambulatory Visit (INDEPENDENT_AMBULATORY_CARE_PROVIDER_SITE_OTHER): Payer: BC Managed Care – PPO | Admitting: Family Medicine

## 2019-10-21 ENCOUNTER — Other Ambulatory Visit: Payer: Self-pay

## 2019-10-21 ENCOUNTER — Encounter: Payer: Self-pay | Admitting: Family Medicine

## 2019-10-21 VITALS — BP 122/78 | HR 92 | Temp 97.7°F | Ht 72.0 in | Wt 207.0 lb

## 2019-10-21 DIAGNOSIS — H9312 Tinnitus, left ear: Secondary | ICD-10-CM

## 2019-10-21 DIAGNOSIS — Z0001 Encounter for general adult medical examination with abnormal findings: Secondary | ICD-10-CM

## 2019-10-21 DIAGNOSIS — Z6828 Body mass index (BMI) 28.0-28.9, adult: Secondary | ICD-10-CM

## 2019-10-21 DIAGNOSIS — J3089 Other allergic rhinitis: Secondary | ICD-10-CM | POA: Diagnosis not present

## 2019-10-21 DIAGNOSIS — E785 Hyperlipidemia, unspecified: Secondary | ICD-10-CM

## 2019-10-21 DIAGNOSIS — Z8042 Family history of malignant neoplasm of prostate: Secondary | ICD-10-CM

## 2019-10-21 DIAGNOSIS — Z125 Encounter for screening for malignant neoplasm of prostate: Secondary | ICD-10-CM | POA: Diagnosis not present

## 2019-10-21 DIAGNOSIS — E663 Overweight: Secondary | ICD-10-CM

## 2019-10-21 LAB — LIPID PANEL
Cholesterol: 218 mg/dL — ABNORMAL HIGH (ref 0–200)
HDL: 56.3 mg/dL (ref >39.00)
LDL Cholesterol: 146 mg/dL — ABNORMAL HIGH (ref 0–99)
NonHDL: 161.68
Total CHOL/HDL Ratio: 4
Triglycerides: 79 mg/dL (ref 0.0–149.0)
VLDL: 15.8 mg/dL (ref 0.0–40.0)

## 2019-10-21 LAB — COMPREHENSIVE METABOLIC PANEL
ALT: 54 U/L — ABNORMAL HIGH (ref 0–53)
AST: 50 U/L — ABNORMAL HIGH (ref 0–37)
Albumin: 4.4 g/dL (ref 3.5–5.2)
Alkaline Phosphatase: 71 U/L (ref 39–117)
BUN: 14 mg/dL (ref 6–23)
CO2: 28 mEq/L (ref 19–32)
Calcium: 9.4 mg/dL (ref 8.4–10.5)
Chloride: 103 mEq/L (ref 96–112)
Creatinine, Ser: 1.04 mg/dL (ref 0.40–1.50)
GFR: 76.78 mL/min (ref >60.00)
Glucose, Bld: 93 mg/dL (ref 70–99)
Potassium: 4.8 mEq/L (ref 3.5–5.1)
Sodium: 137 mEq/L (ref 135–145)
Total Bilirubin: 0.5 mg/dL (ref 0.2–1.2)
Total Protein: 6.4 g/dL (ref 6.0–8.3)

## 2019-10-21 LAB — CBC
HCT: 41.2 % (ref 39.0–52.0)
Hemoglobin: 14 g/dL (ref 13.0–17.0)
MCHC: 33.9 g/dL (ref 30.0–36.0)
MCV: 86.7 fl (ref 78.0–100.0)
Platelets: 263 10*3/uL (ref 150.0–400.0)
RBC: 4.76 Mil/uL (ref 4.22–5.81)
RDW: 13.4 % (ref 11.5–15.5)
WBC: 4.4 10*3/uL (ref 4.0–10.5)

## 2019-10-21 LAB — TSH: TSH: 0.69 u[IU]/mL (ref 0.35–4.50)

## 2019-10-21 LAB — PSA: PSA: 0.52 ng/mL (ref 0.10–4.00)

## 2019-10-21 NOTE — Assessment & Plan Note (Signed)
Check PSA. ?

## 2019-10-21 NOTE — Progress Notes (Signed)
Chief Complaint:  Miguel Jordan is a 44 y.o. male who presents today for his annual comprehensive physical exam.    Assessment/Plan:  Chronic Problems Addressed Today: Allergic rhinitis With middle ear effusion today.  Continue medications per allergy.  Is looking into getting allergy shots.  Tinnitus, left Middle ear effusion today.  No other associated signs or symptoms.  No hearing loss or vertigo.  His effusion could be contributing to tinnitus.  Offered referral to ENT however he deferred for the time being.  Family history of prostate cancer Check PSA.    Body mass index is 28.07 kg/m. / Overweight BMI Metric Follow Up - 10/21/19 0900      BMI Metric Follow Up-Please document annually   BMI Metric Follow Up  Education provided       Preventative Healthcare: Check CBC, C met, TSH, lipid panel.  Check PSA due to family history.  Discussed colon cancer screening.  Patient Counseling(The following topics were reviewed and/or handout was given):  -Nutrition: Stressed importance of moderation in sodium/caffeine intake, saturated fat and cholesterol, caloric balance, sufficient intake of fresh fruits, vegetables, and fiber.  -Stressed the importance of regular exercise.   -Substance Abuse: Discussed cessation/primary prevention of tobacco, alcohol, or other drug use; driving or other dangerous activities under the influence; availability of treatment for abuse.   -Injury prevention: Discussed safety belts, safety helmets, smoke detector, smoking near bedding or upholstery.   -Sexuality: Discussed sexually transmitted diseases, partner selection, use of condoms, avoidance of unintended pregnancy and contraceptive alternatives.   -Dental health: Discussed importance of regular tooth brushing, flossing, and dental visits.  -Health maintenance and immunizations reviewed. Please refer to Health maintenance section.  Return to care in 1 year for next preventative visit.       Subjective:  HPI:  He has no acute complaints today.   Still has ongoing ringing in his left ear. Getting quiter. Ringing is persistent. No hearing loss. No vertigo.   Lifestyle Diet: Balanced. Plenty of fruits and vegetables.  Exercise: Going back into gym and going to weight room. Doing more cardio.   Depression screen [facility] 2/9 10/21/2019  Decreased Interest 0  Down, Depressed, Hopeless 0  PHQ - 2 Score 0   ROS: Per HPI, otherwise a complete review of systems was negative.   PMH:  The following were reviewed and entered/updated in epic: Past Medical History:  Diagnosis Date   Allergy    History of chicken pox    Patient Active Problem List   Diagnosis Date Noted   Dyslipidemia 10/21/2019   Tinnitus, left 03/05/2019   Nasal septal deviation 03/14/2018   Allergic rhinitis 06/26/2017   Family history of prostate cancer 06/26/2017   Past Surgical History:  Procedure Laterality Date   SHOULDER SURGERY     WISDOM TOOTH EXTRACTION  1993    Family History  Problem Relation Age of Onset   Prostate cancer Paternal Grandfather    Colon cancer Neg Hx     Medications- reviewed and updated Current Outpatient Medications  Medication Sig Dispense Refill   albuterol (VENTOLIN HFA) 108 (90 Base) MCG/ACT inhaler Inhale 2 puffs into the lungs every 6 (six) hours as needed for wheezing or shortness of breath (cough). 18 g 2   Azelastine HCl 0.15 % SOLN Place 2 sprays into both nostrils 2 (two) times daily. 30 mL 5   cetirizine (ZYRTEC) 10 MG tablet Take 1 tablet (10 mg total) by mouth daily. 90 tablet 3   flunisolide (  NASALIDE) 25 MCG/ACT (0.025%) SOLN Place 2 sprays into the nose 2 (two) times daily. 25 mL 5   fluticasone (FLONASE) 50 MCG/ACT nasal spray Place 1 spray into both nostrils daily. 16 g 6   levocetirizine (XYZAL) 5 MG tablet Take 1 tablet by mouth once daily 30 tablet 5   montelukast (SINGULAIR) 10 MG tablet Take 1 tablet by mouth once daily 30 tablet  5   No current facility-administered medications for this visit.    Allergies-reviewed and updated No Known Allergies  Social History   Socioeconomic History   Marital status: Married    Spouse name: Not on file   Number of children: 3   Years of education: Not on file   Highest education level: Not on file  Occupational History   Not on file  Tobacco Use   Smoking status: Never Smoker   Smokeless tobacco: Never Used  Substance and Sexual Activity   Alcohol use: Yes    Comment: 3-5 drinks wine an [place]   Drug use: No   Sexual activity: Yes  Other Topics Concern   Not on file  Social History Narrative   Not on file   Social Determinants of Health   Financial Resource Strain:    Difficulty of Paying Living Expenses:   Food Insecurity:    Worried About Charity fundraiser in the Last Year:    Arboriculturist in the Last Year:   Transportation Needs:    Film/video editor (Medical):    Lack of Transportation (Non-Medical):   Physical Activity:    Days of Exercise per Week:    Minutes of Exercise per Session:   Stress:    Feeling of Stress :   Social Connections:    Frequency of Communication with Friends and Family:    Frequency of Social Gatherings with Friends and Family:    Attends Religious Services:    Active Member of Clubs or Organizations:    Attends Music therapist:    Marital Status:         Objective:  Physical Exam: BP 122/78    Pulse 92    Temp 97.7 F (36.5 C)    Ht 6' (1.829 m)    Wt 207 lb (93.9 kg)    SpO2 95%    BMI 28.07 kg/m   Body mass index is 28.07 kg/m. Wt Readings from Last 3 Encounters:  10/21/19 207 lb (93.9 kg)  06/21/19 203 lb 12.8 oz (92.4 kg)  05/24/19 204 lb 4 oz (92.6 kg)   Gen: NAD, resting comfortably HEENT: TMs normal bilaterally. OP clear. No thyromegaly noted.  CV: RRR with no murmurs appreciated Pulm: NWOB, CTAB with no crackles, wheezes, or rhonchi GI: Normal bowel  sounds present. Soft, Nontender, Nondistended. MSK: no edema, cyanosis, or clubbing noted Skin: warm, dry Neuro: CN2-12 grossly intact. Strength 5/5 in upper and lower extremities. Reflexes symmetric and intact bilaterally.  Psych: Normal affect and thought content     Alizandra Loh M. Jerline Pain, MD 10/21/2019 9:00 AM

## 2019-10-21 NOTE — Assessment & Plan Note (Signed)
Middle ear effusion today.  No other associated signs or symptoms.  No hearing loss or vertigo.  His effusion could be contributing to tinnitus.  Offered referral to ENT however he deferred for the time being.

## 2019-10-21 NOTE — Patient Instructions (Signed)
It was very nice to see you today!  We will check blood work today.  Please let me know if you want a referral to ENT.  Please let me know if you would like to be referred for a colonoscopy or if you would like to have a cologuard kit sent to your house.   Take care, Dr Jerline Pain  Please try these tips to maintain a healthy lifestyle:   Eat at least 3 REAL meals and 1-2 snacks per day.  Aim for no more than 5 hours between eating.  If you eat breakfast, please do so within one hour of getting up.    Each meal should contain half fruits/vegetables, one quarter protein, and one quarter carbs (no bigger than a computer mouse)   Cut down on sweet beverages. This includes juice, soda, and sweet tea.     Drink at least 1 glass of water with each meal and aim for at least 8 glasses per day   Exercise at least 150 minutes every week.    Preventive Care 46-46 Years Old, Male Preventive care refers to lifestyle choices and visits with your health care provider that can promote health and wellness. This includes:  A yearly physical exam. This is also called an annual well check.  Regular dental and eye exams.  Immunizations.  Screening for certain conditions.  Healthy lifestyle choices, such as eating a healthy diet, getting regular exercise, not using drugs or products that contain nicotine and tobacco, and limiting alcohol use. What can I expect for my preventive care visit? Physical exam Your health care provider will check:  Height and weight. These may be used to calculate body mass index (BMI), which is a measurement that tells if you are at a healthy weight.  Heart rate and blood pressure.  Your skin for abnormal spots. Counseling Your health care provider may ask you questions about:  Alcohol, tobacco, and drug use.  Emotional well-being.  Home and relationship well-being.  Sexual activity.  Eating habits.  Work and work Statistician. What immunizations do I  need?  Influenza (flu) vaccine  This is recommended every year. Tetanus, diphtheria, and pertussis (Tdap) vaccine  You may need a Td booster every 10 years. Varicella (chickenpox) vaccine  You may need this vaccine if you have not already been vaccinated. Zoster (shingles) vaccine  You may need this after age 45. Measles, mumps, and rubella (MMR) vaccine  You may need at least one dose of MMR if you were born in 1957 or later. You may also need a second dose. Pneumococcal conjugate (PCV13) vaccine  You may need this if you have certain conditions and were not previously vaccinated. Pneumococcal polysaccharide (PPSV23) vaccine  You may need one or two doses if you smoke cigarettes or if you have certain conditions. Meningococcal conjugate (MenACWY) vaccine  You may need this if you have certain conditions. Hepatitis A vaccine  You may need this if you have certain conditions or if you travel or work in places where you may be exposed to hepatitis A. Hepatitis B vaccine  You may need this if you have certain conditions or if you travel or work in places where you may be exposed to hepatitis B. Haemophilus influenzae type b (Hib) vaccine  You may need this if you have certain risk factors. Human papillomavirus (HPV) vaccine  If recommended by your health care provider, you may need three doses over 6 months. You may receive vaccines as individual doses or as  more than one vaccine together in one shot (combination vaccines). Talk with your health care provider about the risks and benefits of combination vaccines. What tests do I need? Blood tests  Lipid and cholesterol levels. These may be checked every 5 years, or more frequently if you are over 39 years old.  Hepatitis C test.  Hepatitis B test. Screening  Lung cancer screening. You may have this screening every year starting at age 40 if you have a 30-pack-year history of smoking and currently smoke or have quit within  the past 15 years.  Prostate cancer screening. Recommendations will vary depending on your family history and other risks.  Colorectal cancer screening. All adults should have this screening starting at age 27 and continuing until age 39. Your health care provider may recommend screening at age 39 if you are at increased risk. You will have tests every 1-10 years, depending on your results and the type of screening test.  Diabetes screening. This is done by checking your blood sugar (glucose) after you have not eaten for a while (fasting). You may have this done every 1-3 years.  Sexually transmitted disease (STD) testing. Follow these instructions at home: Eating and drinking  Eat a diet that includes fresh fruits and vegetables, whole grains, lean protein, and low-fat dairy products.  Take vitamin and mineral supplements as recommended by your health care provider.  Do not drink alcohol if your health care provider tells you not to drink.  If you drink alcohol: ? Limit how much you have to 0-2 drinks a day. ? Be aware of how much alcohol is in your drink. In the [place] one drink equals one 12 oz bottle of beer (355 mL), one 5 oz glass of wine (148 mL), or one 1 oz glass of hard liquor (44 mL). Lifestyle  Take daily care of your teeth and gums.  Stay active. Exercise for at least 30 minutes on 5 or more days each week.  Do not use any products that contain nicotine or tobacco, such as cigarettes, e-cigarettes, and chewing tobacco. If you need help quitting, ask your health care provider.  If you are sexually active, practice safe sex. Use a condom or other form of protection to prevent STIs (sexually transmitted infections).  Talk with your health care provider about taking a low-dose aspirin every day starting at age 66. What's next?  Go to your health care provider once a year for a well check visit.  Ask your health care provider how often you should have your eyes and teeth  checked.  Stay up to date on all vaccines. This information is not intended to replace advice given to you by your health care provider. Make sure you discuss any questions you have with your health care provider. Document Revised: 04/26/2018 Document Reviewed: 04/26/2018 Elsevier Patient Education  2020 [place] Screening  Colorectal cancer screening is a group of tests that are used to check for colorectal cancer before symptoms develop. Colorectal refers to the colon and rectum. The colon and rectum are located at the end of the digestive tract and carry bowel movements out of the body. Who should have screening? All adults starting at age 29 until age 1 should have screening. Your health care provider may recommend screening at age 39. You will have tests every 1-10 years, depending on your results and the type of screening test. You may have screening tests starting at an earlier age, or more frequently  than other people, if you have any of the following risk factors:  A personal or family history of colorectal cancer or abnormal growths (polyps).  Inflammatory bowel disease, such as ulcerative colitis or Crohn's disease.  A history of having radiation treatment to the abdomen or pelvic area for cancer.  Colorectal cancer symptoms, such as changes in bowel habits or blood in your stool.  A type of colon cancer syndrome that is passed from parent to child (hereditary), such as: ? Lynch syndrome. ? Familial adenomatous polyposis. ? Turcot syndrome. ? Peutz-Jeghers syndrome. Screening recommendations for adults who are 38-34 years old vary depending on health. How is screening done? There are several types of colorectal screening tests. You may have one or more of the following:  Guaiac-based fecal occult blood testing. For this test, a stool (feces) sample is checked for hidden (occult) blood, which could be a sign of colorectal cancer.  Fecal  immunochemical test (FIT). For this test, a stool sample is checked for blood, which could be a sign of colorectal cancer.  Stool DNA test. For this test, a stool sample is checked for blood and changes in DNA that could lead to colorectal cancer.  Sigmoidoscopy. During this test, a thin, flexible tube with a camera on the end (sigmoidoscope) is used to examine the rectum and the lower colon.  Colonoscopy. During this test, a long, flexible tube with a camera on the end (colonoscope) is used to examine the entire colon and rectum. With a colonoscopy, it is possible to take a sample of tissue (biopsy) and remove small polyps during the test.  Virtual colonoscopy. Instead of a colonoscope, this type of colonoscopy uses X-rays (CT scan) and computers to produce images of the colon and rectum. What are the benefits of screening? Screening reduces your risk for colorectal cancer and can help identify cancer at an early stage, when the cancer can be removed or treated more easily. It is common for polyps to form in the lining of the colon, especially as you age. These polyps may be cancerous or become cancerous over time. Screening can identify these polyps. What are the risks of screening? Each screening test may have different risks.  Stool sample tests have fewer risks than other types of screening tests. However, you may need more tests to confirm results from a stool sample test.  Screening tests that involve X-rays expose you to low levels of radiation, which may slightly increase your cancer risk. The benefit of detecting cancer outweighs the slight increase in risk.  Screening tests such as sigmoidoscopy and colonoscopy may place you at risk for bleeding, intestinal damage, infection, or a reaction to medicines given during the exam. Talk with your health care provider to understand your risk for colorectal cancer and to make a screening plan that is right for you. Questions to ask your health  care provider  When should I start colorectal cancer screening?  What is my risk for colorectal cancer?  How often do I need screening?  Which screening tests do I need?  How do I get my test results?  What do my results mean? Where to find more information Learn more about colorectal cancer screening from:  The [place]: www.cancer.org  The [employer]: www.cancer.gov Summary  Colorectal cancer screening is a group of tests used to check for colorectal cancer before symptoms develop.  Screening reduces your risk for colorectal cancer and can help identify cancer at an early stage, when  the cancer can be removed or treated more easily.  All adults starting at age 23 until age 24 should have screening. Your health care provider may recommend screening at age 85.  You may have screening tests starting at an earlier age, or more frequently than other people, if you have certain risk factors.  Talk with your health care provider to understand your risk for colorectal cancer and to make a screening plan that is right for you. This information is not intended to replace advice given to you by your health care provider. Make sure you discuss any questions you have with your health care provider. Document Revised: 08/22/2018 Document Reviewed: 02/01/2017 Elsevier Patient Education  [place].

## 2019-10-21 NOTE — Assessment & Plan Note (Signed)
With middle ear effusion today.  Continue medications per allergy.  Is looking into getting allergy shots.

## 2019-10-22 ENCOUNTER — Other Ambulatory Visit: Payer: Self-pay | Admitting: *Deleted

## 2019-10-22 DIAGNOSIS — R7401 Elevation of levels of liver transaminase levels: Secondary | ICD-10-CM

## 2019-10-22 NOTE — Progress Notes (Signed)
Please inform patient of the following:  Liver numbers up slightly but cholesterol is much better.  Everything else is NORMAL. Would like for him to come back in a week or so to recheck liver numbers. Please place future order for CMET.  Katina Degree. Jimmey Ralph, MD 10/22/2019 2:24 PM

## 2019-11-05 ENCOUNTER — Other Ambulatory Visit (INDEPENDENT_AMBULATORY_CARE_PROVIDER_SITE_OTHER): Payer: BC Managed Care – PPO

## 2019-11-05 ENCOUNTER — Other Ambulatory Visit: Payer: Self-pay

## 2019-11-05 DIAGNOSIS — R7401 Elevation of levels of liver transaminase levels: Secondary | ICD-10-CM | POA: Diagnosis not present

## 2019-11-05 LAB — COMPREHENSIVE METABOLIC PANEL
ALT: 41 U/L (ref 0–53)
AST: 28 U/L (ref 0–37)
Albumin: 4.5 g/dL (ref 3.5–5.2)
Alkaline Phosphatase: 65 U/L (ref 39–117)
BUN: 15 mg/dL (ref 6–23)
CO2: 28 mEq/L (ref 19–32)
Calcium: 9.5 mg/dL (ref 8.4–10.5)
Chloride: 104 mEq/L (ref 96–112)
Creatinine, Ser: 1.02 mg/dL (ref 0.40–1.50)
GFR: 78.51 mL/min (ref >60.00)
Glucose, Bld: 88 mg/dL (ref 70–99)
Potassium: 4.2 mEq/L (ref 3.5–5.1)
Sodium: 139 mEq/L (ref 135–145)
Total Bilirubin: 0.6 mg/dL (ref 0.2–1.2)
Total Protein: 6.5 g/dL (ref 6.0–8.3)

## 2019-11-05 NOTE — Progress Notes (Signed)
Please inform patient of the following:  Liver numbers are back to baseline. Do not need to do any further testing at this point. We can recheck again in a year.

## 2019-11-08 ENCOUNTER — Ambulatory Visit: Payer: BC Managed Care – PPO | Admitting: Family Medicine

## 2019-12-16 ENCOUNTER — Other Ambulatory Visit: Payer: Self-pay | Admitting: Allergy

## 2020-02-03 DIAGNOSIS — Z20828 Contact with and (suspected) exposure to other viral communicable diseases: Secondary | ICD-10-CM | POA: Diagnosis not present

## 2020-02-05 ENCOUNTER — Telehealth: Payer: Self-pay

## 2020-02-05 NOTE — Telephone Encounter (Signed)
Pt.'s wife has called. Pt has tested positive for covid yesterday with an at home test. He tested at [facility] on Monday with a PCR test, but hasn't received results.  Pt is on day 4 of symptoms. He is experiencing body aches, fever, headache, cough, occasional dizziness, fever, and lightheadedness. Pt has had  severe asthma his whole life. They are concerned since he is high risk and want to know what else can be done for his care.

## 2020-02-05 NOTE — Telephone Encounter (Signed)
Please schedule a virtual visit for pt to discuss.

## 2020-02-06 ENCOUNTER — Encounter: Payer: Self-pay | Admitting: Family Medicine

## 2020-02-06 ENCOUNTER — Telehealth: Payer: Self-pay | Admitting: Family Medicine

## 2020-02-06 ENCOUNTER — Telehealth (INDEPENDENT_AMBULATORY_CARE_PROVIDER_SITE_OTHER): Payer: BC Managed Care – PPO | Admitting: Family Medicine

## 2020-02-06 ENCOUNTER — Telehealth: Payer: BC Managed Care – PPO | Admitting: Family Medicine

## 2020-02-06 VITALS — Temp 100.5°F | Ht 72.0 in | Wt 207.0 lb

## 2020-02-06 DIAGNOSIS — U071 COVID-19: Secondary | ICD-10-CM | POA: Diagnosis not present

## 2020-02-06 DIAGNOSIS — J45909 Unspecified asthma, uncomplicated: Secondary | ICD-10-CM

## 2020-02-06 MED ORDER — IVERMECTIN 3 MG PO TABS: 300.0000 ug/kg | ORAL_TABLET | Freq: Every day | ORAL | 0 refills | Status: DC

## 2020-02-06 MED ORDER — BUDESONIDE 1 MG/2ML IN SUSP
1.0000 mg | Freq: Every day | RESPIRATORY_TRACT | 1 refills | Status: DC
Start: 2020-02-06 — End: 2021-03-11

## 2020-02-06 MED ORDER — PREDNISONE 20 MG PO TABS
40.0000 mg | ORAL_TABLET | Freq: Every day | ORAL | 0 refills | Status: DC
Start: 2020-02-06 — End: 2021-03-11

## 2020-02-06 MED ORDER — AZITHROMYCIN 250 MG PO TABS: ORAL_TABLET | ORAL | 0 refills | Status: DC

## 2020-02-06 NOTE — Telephone Encounter (Signed)
Initial Comment Caller states that her husband has tested positive for COVID. He has a cough, started on day two. He has hx asthma, he has a wheezing cough. He did get light-headed. His fingertips were pale. She is asking for the COVID infusion for him. Translation No Nurse Assessment Nurse: Garald Braver, RN, Thayer Ohm Date/Time Lamount Cohen Time): 02/05/2020 2:19:22 PM Confirm and document reason for call. If symptomatic, describe symptoms. ---Caller states that her husband has tested positive for COVID. He has a cough, started on day two. He has hx asthma, he has a wheezing cough. He did get light-headed. His fingertips were pale. Does the patient have any new or worsening symptoms? ---Yes Will a triage be completed? ---Yes Related visit to physician within the last 2 weeks? ---No Does the PT have any chronic conditions? (i.e. diabetes, asthma, this includes High risk factors for pregnancy, etc.) ---Yes List chronic conditions. ---asthma Is this a behavioral health or substance abuse call? ---No Guidelines Guideline Title Affirmed Question Affirmed Notes Nurse Date/Time (Eastern Time) COVID-19 - Diagnosed or Suspected MILD difficulty breathing (e.g., minimal/no SOB at rest, SOB with walking, pulse <100) Garald Braver, RN, Thayer Ohm 02/05/2020 2:20:00 PM Disp. Time Lamount Cohen Time) Disposition Final User 02/05/2020 2:15:51 PM Send to Urgent Cherrie Distance, ShelbePLEASE NOTE: All timestamps contained within this report are represented as [place] Standard Time. CONFIDENTIALTY NOTICE: This fax transmission is intended only for the addressee. It contains information that is legally privileged, confidential or otherwise protected from use or disclosure. If you are not the intended recipient, you are strictly prohibited from reviewing, disclosing, copying using or disseminating any of this information or taking any action in reliance on or regarding this information. If you have received this fax in error, please  notify us immediately by telephone so that we can arrange for its return to [place]. Phone: [phone], Toll-Free: [phone], Fax: [phone] Page: 2 of 2 Call Id: [id number] 02/05/2020 2:30:52 PM Go to ED Now (or PCP triage) Yes Garald Braver, RN, Joya Salm Disagree/Comply Disagree Caller Understands Yes PreDisposition Call Doctor Care Advice Given Per Guideline * IF NO PCP (PRIMARY CARE PROVIDER) SECOND-LEVEL TRIAGE: You need to be seen within the next hour. Go to the ED/UCC at _____________ Hospital. Leave as soon as you can. GO TO ED NOW (OR PCP TRIAGE): CARE ADVICE given per COVID-19 - DIAGNOSED OR SUSPECTED (Adult) guideline. Referrals GO TO FACILITY REFUSED Warm transfer to backlin

## 2020-02-06 NOTE — Telephone Encounter (Signed)
Pt had virtual visit with Dr. Artis Flock.

## 2020-02-06 NOTE — Progress Notes (Signed)
Patient: Miguel Jordan MRN: [id number] DOB: [birth year] PCP: Ardith Dark, MD     I connected with Arvilla Meres on 02/06/20 at 2:42pm  by a video enabled telemedicine application and verified that I am speaking with the correct person using two identifiers.  Location patient: Home Location provider: [facility], Office Persons participating in this virtual visit: Juliane Poot, Dr. Artis Flock  I discussed the limitations of evaluation and management by telemedicine and the availability of in person appointments. The patient expressed understanding and agreed to proceed.   Subjective:  Chief Complaint  Patient presents with  . Covid Positive    Symptoms started Sunday 19th    HPI: The patient is a 44 y.o. male who presents today for Positive Covid Test. He is on day 5 of symptoms. He says that he woke up early Sunday morning with a severe headache behind the eyes. His fever was at 100.0 that day with, fatigue,and chills. Symptoms have progressed and the passed 2 days worsened. He has tried Tylenol that does help. It brought down recent fever. He complains of constant coughing.  And has no taste or smell. He got his positive covid test back yesterday. He states today has been rough. He is tired and has constant coughing that is getting worse. He states he is short of breath, but he has no chest tightness. If he tries to inhale he will start to cough. Reminds him of the fall asthma he gets. He has been quarantining since Sunday. Pulse ox has been "good" he says per wife. Around no sick contacts, unsure where he could have gotten this from.   Review of Systems  Constitutional: Positive for chills, fatigue and fever.  HENT: Negative for congestion and sore throat.   Respiratory: Positive for cough. Negative for shortness of breath.   Cardiovascular: Negative for chest pain and palpitations.  Gastrointestinal: Negative for abdominal pain, diarrhea, nausea and vomiting.  Musculoskeletal:  Positive for arthralgias and myalgias.  Neurological: Positive for dizziness. Negative for headaches.    Allergies Patient has No Known Allergies.  Past Medical History Patient  has a past medical history of Allergy and History of chicken pox.  Surgical History Patient  has a past surgical history that includes Wisdom tooth extraction (1993) and Shoulder surgery.  Family History Pateint's family history includes Prostate cancer in his paternal grandfather.  Social History Patient  reports that he has never smoked. He has never used smokeless tobacco. He reports current alcohol use. He reports that he does not use drugs.    Objective: Vitals:   02/06/20 1424  Temp: (!) 100.5 F (38.1 C)  TempSrc: Temporal  Weight: 207 lb (93.9 kg)  Height: 6' (1.829 m)    Body mass index is 28.07 kg/m.  Physical Exam Vitals reviewed.  Constitutional:      General: He is not in acute distress.    Appearance: Normal appearance. He is normal weight. He is not ill-appearing.  HENT:     Head: Normocephalic and atraumatic.  Pulmonary:     Effort: Pulmonary effort is normal.     Comments: Talking in full complete sentences. Comfortable.  Neurological:     General: No focal deficit present.     Mental Status: He is alert and oriented to person, place, and time.  Psychiatric:        Mood and Affect: Mood normal.        Behavior: Behavior normal.        Assessment/plan: 1. COVID-19 -setting  him up with infusion with hx of asthma  -starting him on treatment nutraceutical bundle including zinc sulfate, vit D, vit C, quercetin and melatonin 5-15+ days depending on symptoms.  -pulmicort nebulizer BID -oral steroids x 5 day burst -ivermectin X5 days. Discussed with family that this is still considered experimental; however, multiple RCT have been done that have shown significant benefit with it's use. They would like to proceed. Discussed weight based and to take with food.  -ASA 325mg  (no  contraindication) x10-20 days -discussed hold all NSAIDs, would just do tylenol since on ASA.  -azithromycin 500mg  x 5 days.  -gargle mouthwash TID -outside as much as possible.  -keep oxygen >92-93%.  -strict precautions given.    Return if symptoms worsen or fail to improve.    , MD [facility]  02/06/2020

## 2020-02-06 NOTE — Patient Instructions (Addendum)
For covid treatment.Marland KitchenMarland Kitchen 1) the infusion center should call you in next day or two. If you don't hear from them by tomorrow afternoon, I would call and leave your information.   2) for treatment....  -outside, walk around, deep breathing.  -tylenol prn for fever/aches   1) vitamin D3 5000IU/day 2) vitamin C: 3000mg /day 3) quercetin: 500mg  twice a day  4) melatonin 10mg  before bedtime (may cause drowsiness)  5) zinc: 50-100mg /day  6) Aspirin: 325mg /day Gargle mouthwash like scope/act/crest twice a day.  Do above for 10-20 days or until back to baseline.   Medication sent to pharmacy.  1) ivermectin sent to one pharmacy. MUST take with food. Dosed off weight. Would split up between meals.  2) azithromycin is an antibiotic to help with inflammation. Will take twice a day for 5 days.  3) pulmicort nebulizer. Inhaled steroid you will use daily to twice a day 4) oral steroids daily x 5 days  Want oxygen >92-93%...  Nice meeting you! Praying for quick healing,   Dr. 

## 2020-02-07 ENCOUNTER — Telehealth: Payer: Self-pay

## 2020-02-07 ENCOUNTER — Ambulatory Visit (HOSPITAL_COMMUNITY)
Admission: RE | Admit: 2020-02-07 | Discharge: 2020-02-07 | Disposition: A | Discharge status: Home / Self Care | Payer: BC Managed Care – PPO | Source: Ambulatory Visit | Attending: Pulmonary Disease | Admitting: Pulmonary Disease

## 2020-02-07 ENCOUNTER — Telehealth: Payer: Self-pay | Admitting: Hospice and Palliative Medicine

## 2020-02-07 ENCOUNTER — Other Ambulatory Visit (HOSPITAL_COMMUNITY): Payer: Self-pay | Admitting: Nurse Practitioner

## 2020-02-07 ENCOUNTER — Other Ambulatory Visit: Payer: Self-pay

## 2020-02-07 DIAGNOSIS — U071 COVID-19: Secondary | ICD-10-CM | POA: Insufficient documentation

## 2020-02-07 DIAGNOSIS — J45909 Unspecified asthma, uncomplicated: Secondary | ICD-10-CM | POA: Diagnosis not present

## 2020-02-07 MED ORDER — METHYLPREDNISOLONE SODIUM SUCC 125 MG IJ SOLR: 125.0000 mg | Freq: Once | INTRAMUSCULAR | Status: DC | PRN

## 2020-02-07 MED ORDER — EPINEPHRINE 0.3 MG/0.3ML IJ SOAJ: 0.3000 mg | Freq: Once | INTRAMUSCULAR | Status: DC | PRN

## 2020-02-07 MED ORDER — ALBUTEROL SULFATE HFA 108 (90 BASE) MCG/ACT IN AERS: 2.0000 | INHALATION_SPRAY | Freq: Once | RESPIRATORY_TRACT | Status: DC | PRN

## 2020-02-07 MED ORDER — SODIUM CHLORIDE 0.9 % IV SOLN: INTRAVENOUS | Status: DC | PRN

## 2020-02-07 MED ORDER — SODIUM CHLORIDE 0.9 % IV SOLN
1200.0000 mg | Freq: Once | INTRAVENOUS | Status: AC
  Administered 2020-02-07: 1200 mg via INTRAVENOUS

## 2020-02-07 MED ORDER — DIPHENHYDRAMINE HCL 50 MG/ML IJ SOLN: 50.0000 mg | Freq: Once | INTRAMUSCULAR | Status: DC | PRN

## 2020-02-07 MED ORDER — FAMOTIDINE IN NACL 20-0.9 MG/50ML-% IV SOLN: 20.0000 mg | Freq: Once | INTRAVENOUS | Status: DC | PRN

## 2020-02-07 NOTE — Telephone Encounter (Signed)
budesonide (PULMICORT) 1 MG/2ML nebulizer solution   Pt states that the pharmacy called him and told him needs a prescription for a nebulizer machine, since this medication is for a nebulizer.

## 2020-02-07 NOTE — Telephone Encounter (Signed)
Rx for nebulizer was faxed to [facility] supply and the pt was notified

## 2020-02-07 NOTE — Progress Notes (Signed)
  Diagnosis: COVID-19  Physician: Dr. Wright  Procedure: [facility] Infusion Clinic Med: casirivimab\imdevimab infusion - Provided patient with casirivimab\imdevimab fact sheet for patients, parents and caregivers prior to infusion.  Complications: No immediate complications noted.  Discharge: Discharged home   Britanie Harshman M Dorien Mayotte 02/07/2020  

## 2020-02-07 NOTE — Progress Notes (Signed)
I connected by phone with Miguel Jordan on 02/07/2020 at 10:42 AM to discuss the potential use of a new treatment for mild to moderate COVID-19 viral infection in non-hospitalized patients.  This patient is a 44 y.o. male that meets the FDA criteria for Emergency Use Authorization of COVID monoclonal antibody casirivimab/imdevimab or bamlanivimab/eteseviamb.  Has a (+) direct SARS-CoV-2 viral test result  Has mild or moderate COVID-19   Is NOT hospitalized due to COVID-19  Is within 10 days of symptom onset  Has at least one of the high risk factor(s) for progression to severe COVID-19 and/or hospitalization as defined in EUA.  Specific high risk criteria : BMI > 25 and Chronic Lung Disease   I have spoken and communicated the following to the patient or parent/caregiver regarding COVID monoclonal antibody treatment:  1. FDA has authorized the emergency use for the treatment of mild to moderate COVID-19 in adults and pediatric patients with positive results of direct SARS-CoV-2 viral testing who are 47 years of age and older weighing at least 40 kg, and who are at high risk for progressing to severe COVID-19 and/or hospitalization.  2. The significant known and potential risks and benefits of COVID monoclonal antibody, and the extent to which such potential risks and benefits are unknown.  3. Information on available alternative treatments and the risks and benefits of those alternatives, including clinical trials.  4. Patients treated with COVID monoclonal antibody should continue to self-isolate and use infection control measures (e.g., wear mask, isolate, social distance, avoid sharing personal items, clean and disinfect "high touch" surfaces, and frequent handwashing) according to CDC guidelines.   5. The patient or parent/caregiver has the option to accept or refuse COVID monoclonal antibody treatment.  After reviewing this information with the patient, the patient has agreed to  receive one of the available covid 19 monoclonal antibodies and will be provided an appropriate fact sheet prior to infusion. [name], NP 02/07/2020 10:42 AM

## 2020-02-07 NOTE — Discharge Instructions (Signed)

## 2020-02-07 NOTE — Telephone Encounter (Signed)
I called to discuss with patient about Covid-19 symptoms and the use of regeneron, a monoclonal antibody infusion for those with mild to moderate Covid-19 symptoms and at a high risk of hospitalization.     Pt is qualified for this infusion at the [facility] due to co-morbid conditions and/or a member of an at-risk group.     Unable to reach pt. Left message to return call  Oak Dorey, PhD, NP-C [phone] (Infusion Center Hotline)  

## 2020-02-13 ENCOUNTER — Telehealth: Payer: Self-pay

## 2020-02-13 ENCOUNTER — Other Ambulatory Visit: Payer: Self-pay | Admitting: *Deleted

## 2020-02-13 MED ORDER — ALBUTEROL SULFATE HFA 108 (90 BASE) MCG/ACT IN AERS: 2.0000 | INHALATION_SPRAY | Freq: Four times a day (QID) | RESPIRATORY_TRACT | 2 refills | Status: DC | PRN

## 2020-02-13 NOTE — Telephone Encounter (Signed)
Rx send to pharmacy  

## 2020-02-13 NOTE — Telephone Encounter (Signed)
.   LAST APPOINTMENT DATE: 02/07/2020   NEXT APPOINTMENT DATE:@Visit  date not found  MEDICATION:albuterol (VENTOLIN HFA) 108 (90 Base) MCG/ACT inhaler    PHARMACY:[id number] - [place], [place] - [address]

## 2020-03-16 DIAGNOSIS — L989 Disorder of the skin and subcutaneous tissue, unspecified: Secondary | ICD-10-CM | POA: Diagnosis not present

## 2020-03-16 DIAGNOSIS — Q828 Other specified congenital malformations of skin: Secondary | ICD-10-CM | POA: Diagnosis not present

## 2020-03-16 DIAGNOSIS — L814 Other melanin hyperpigmentation: Secondary | ICD-10-CM | POA: Diagnosis not present

## 2020-03-16 DIAGNOSIS — D485 Neoplasm of uncertain behavior of skin: Secondary | ICD-10-CM | POA: Diagnosis not present

## 2020-03-16 DIAGNOSIS — D229 Melanocytic nevi, unspecified: Secondary | ICD-10-CM | POA: Diagnosis not present

## 2020-06-26 ENCOUNTER — Other Ambulatory Visit: Payer: Self-pay | Admitting: Allergy

## 2020-07-27 ENCOUNTER — Other Ambulatory Visit: Payer: Self-pay | Admitting: Allergy

## 2020-07-30 ENCOUNTER — Telehealth: Payer: Self-pay

## 2020-07-30 ENCOUNTER — Telehealth: Payer: Self-pay | Admitting: Allergy

## 2020-07-30 MED ORDER — MONTELUKAST SODIUM 10 MG PO TABS
10.0000 mg | ORAL_TABLET | Freq: Every day | ORAL | 0 refills | Status: DC
Start: 2020-07-30 — End: 2020-08-20

## 2020-07-30 NOTE — Telephone Encounter (Signed)
  LAST APPOINTMENT DATE: 10/21/2019   NEXT APPOINTMENT DATE:@3 /21/2022  MEDICATION: montelukast (SINGULAIR) 10 MG tablet  PHARMACY:[id number] - [place], [place] - [address]  Comments: Patient wants to know if the medication can be sent in today.

## 2020-07-30 NOTE — Telephone Encounter (Signed)
Pt called to get a refill on singulair, I let patient know that last refill was a courtesy refill & he would need an office visit to get refill on singulair. Pt attempted to schedule appt, but was unable to due to his schedule. Pt stated he would call back to schedule appointment. Pt still asked if he could get a refill anyways as allergy season is coming up.   Please advise.

## 2020-07-30 NOTE — Telephone Encounter (Signed)
Spoke with patient, he stated that he was trying to get an appointment within the next two weeks however due to his schedule he was not able to. He asked for one more refill to give him a little time to get in to be seen. One last courtesy refill sent and patient is aware he will need an office visit for further refills. Patient stated that he would call the office back to schedule an appointment. Patient was very appreciative for the refill.

## 2020-07-30 NOTE — Telephone Encounter (Signed)
Rx was send by Allergy and Asthma provider

## 2020-08-03 ENCOUNTER — Telehealth: Payer: BC Managed Care – PPO | Admitting: Family Medicine

## 2020-08-19 NOTE — Patient Instructions (Addendum)
Seasonal and perennial allergic rhinitis (grass, weeds, trees, dust mite, cat, dog and tobacco leaf) May use an over-the-counter antihistamine once a day as needed for runny nose or itching such as Allegra (fexofenadine) 180 mg, Zyrtec (cetirizine) 10 mg, or Xyzal (levocetirizine,) 5 mg. You can alternate between 2 every couple of months to help prevent tachyphylaxis Continue Singulair 10 mg at night May use saline nasal rinse as needed for nasal symptoms.  Use this prior to any medicated nasal sprays. Continue Flonase (fluticasone) 1 spray each nostril once a day as needed for stuffy nose Continue azelastine nasal spray 2 sprays each nostril twice a day as needed for runny nose/drainage Continue Pataday 1 drop each eye once a day as needed for itchy watery eyes Consider allergy injections if symptoms are not controlled with medications  Allergic cough May use albuterol 2 puffs every 4-6 hours for cough, wheeze, tightness in chest, shortness of breath.  Also may use albuterol 2 puffs 5 to 15 minutes prior to exercise. Continue Singulair 10 mg as above  Please let us know if this treatment plan is not working well for you Schedule a follow-up appointment in 6 months or sooner if needed

## 2020-08-20 ENCOUNTER — Encounter: Payer: Self-pay | Admitting: Family

## 2020-08-20 ENCOUNTER — Other Ambulatory Visit: Payer: Self-pay

## 2020-08-20 ENCOUNTER — Ambulatory Visit (INDEPENDENT_AMBULATORY_CARE_PROVIDER_SITE_OTHER): Payer: Managed Care, Other (non HMO) | Admitting: Family

## 2020-08-20 VITALS — BP 122/78 | HR 73 | Temp 97.6°F | Resp 16 | Ht 61.15 in | Wt 207.8 lb

## 2020-08-20 DIAGNOSIS — J3089 Other allergic rhinitis: Secondary | ICD-10-CM

## 2020-08-20 DIAGNOSIS — R058 Other specified cough: Secondary | ICD-10-CM

## 2020-08-20 DIAGNOSIS — H1013 Acute atopic conjunctivitis, bilateral: Secondary | ICD-10-CM | POA: Diagnosis not present

## 2020-08-20 MED ORDER — FLUTICASONE PROPIONATE 50 MCG/ACT NA SUSP
NASAL | 5 refills | Status: DC
Start: 2020-08-20 — End: 2020-10-02

## 2020-08-20 MED ORDER — MONTELUKAST SODIUM 10 MG PO TABS
10.0000 mg | ORAL_TABLET | Freq: Every day | ORAL | 5 refills | Status: DC
Start: 2020-08-20 — End: 2020-10-02

## 2020-08-20 MED ORDER — AZELASTINE HCL 0.1 % NA SOLN: NASAL | 5 refills | Status: DC

## 2020-08-20 MED ORDER — OLOPATADINE HCL 0.2 % OP SOLN: OPHTHALMIC | 3 refills | Status: DC

## 2020-08-20 NOTE — Progress Notes (Signed)
[address] [place] [address] Dept: [phone]  FOLLOW UP NOTE  Patient ID: Miguel Jordan, male    DOB: March 08, 1976  Age: 45 y.o. MRN: [id number] Date of Office Visit: 08/20/2020  Assessment  Chief Complaint: Allergic Rhinitis   HPI Miguel Jordan is a 45 year old male who presents today for follow-up of allergic rhinitis and allergic cough.  He was last seen on June 21, 2019 by Dr. Delorse Lek.  Allergic rhinitis is reported as moderately controlled with Xyzal 5 mg once a day, Singulair 10 mg once a day, and Flonase nasal spray, and azelastine nasal spray in the spring and fall.  He reports that his symptoms are predominantly worse in the fall.  Right now he denies any rhinorrhea, nasal congestion, and postnasal drip.  He has had 1 sinus infection since his last office visit.  Allergic cough is reported as controlled right now.  He reports that this cough generally starts in October and can last through December.  He describes the cough as a dry hacking cough.  Instructed him that we would have bring him back in fall so we could see him while he was having this cough.  Right now he denies any coughing, wheezing, tightness in his chest, and shortness of breath.   Drug Allergies:  No Known Allergies  Review of Systems: Review of Systems  Constitutional: Negative for chills and fever.  HENT:       Denies rhinorrhea, nasal congestion, postnasal drip.  Has had 1 sinus infection since his last office visit  Eyes:       Reports itchy watery eyes  Respiratory: Negative for cough, shortness of breath and wheezing.   Cardiovascular: Negative for chest pain and palpitations.  Gastrointestinal: Negative for abdominal pain and heartburn.  Genitourinary: Negative for dysuria.  Skin: Negative for itching and rash.  Neurological: Negative for headaches.  Endo/Heme/Allergies: Positive for environmental allergies.     Physical Exam: BP 122/78   Pulse 73   Temp 97.6 F (36.4 C)  (Temporal)   Resp 16   Ht 5' 1.15" (1.553 m)   Wt 207 lb 12 oz (94.2 kg)   SpO2 99%   BMI 39.06 kg/m    Physical Exam Constitutional:      Appearance: Normal appearance.  HENT:     Head: Normocephalic and atraumatic.     Comments: Pharynx normal, eyes normal, ears normal, nose: Bilateral lower turbinate mildly edematous and slightly erythematous with no drainage noted    Right Ear: Tympanic membrane, ear canal and external ear normal.     Left Ear: Tympanic membrane, ear canal and external ear normal.     Mouth/Throat:     Mouth: Mucous membranes are moist.     Pharynx: Oropharynx is clear.  Eyes:     Conjunctiva/sclera: Conjunctivae normal.  Cardiovascular:     Rate and Rhythm: Regular rhythm.     Heart sounds: Normal heart sounds.  Pulmonary:     Effort: Pulmonary effort is normal.     Breath sounds: Normal breath sounds.     Comments: Lungs clear to auscultation Musculoskeletal:     Cervical back: Neck supple.  Skin:    General: Skin is warm.  Neurological:     Mental Status: He is alert and oriented to person, place, and time.  Psychiatric:        Mood and Affect: Mood normal.        Behavior: Behavior normal.  Thought Content: Thought content normal.        Judgment: Judgment normal.     Diagnostics: None  Assessment and Plan: 1. Non-seasonal allergic rhinitis due to other allergic trigger   2. Allergic conjunctivitis of both eyes   3. Allergic cough     Meds ordered this encounter  Medications  . fluticasone (FLONASE) 50 MCG/ACT nasal spray    Sig: Place 1 spray in each nostril once a day as needed for stuffy nose    Dispense:  16 g    Refill:  5  . montelukast (SINGULAIR) 10 MG tablet    Sig: Take 1 tablet (10 mg total) by mouth daily.    Dispense:  30 tablet    Refill:  5  . azelastine (ASTELIN) 0.1 % nasal spray    Sig: Place 2 sprays in each nostril twice a day as needed for runny nose/drainage down throat    Dispense:  30 mL    Refill:   5  . Olopatadine HCl 0.2 % SOLN    Sig: 1 drop in each eye once a day as needed for itchy watery eyes    Dispense:  2.5 mL    Refill:  3    Patient Instructions  Seasonal and perennial allergic rhinitis (grass, weeds, trees, dust mite, cat, dog and tobacco leaf) May use an over-the-counter antihistamine once a day as needed for runny nose or itching such as Allegra (fexofenadine) 180 mg, Zyrtec (cetirizine) 10 mg, or Xyzal (levocetirizine,) 5 mg. You can alternate between 2 every couple of months to help prevent tachyphylaxis Continue Singulair 10 mg at night May use saline nasal rinse as needed for nasal symptoms.  Use this prior to any medicated nasal sprays. Continue Flonase (fluticasone) 1 spray each nostril once a day as needed for stuffy nose Continue azelastine nasal spray 2 sprays each nostril twice a day as needed for runny nose/drainage Continue Pataday 1 drop each eye once a day as needed for itchy watery eyes Consider allergy injections if symptoms are not controlled with medications  Allergic cough May use albuterol 2 puffs every 4-6 hours for cough, wheeze, tightness in chest, shortness of breath.  Also may use albuterol 2 puffs 5 to 15 minutes prior to exercise. Continue Singulair 10 mg as above  Please let us know if this treatment plan is not working well for you Schedule a follow-up appointment in 6 months or sooner if needed   Return in about 6 months (around 02/19/2021), or if symptoms worsen or fail to improve.    Thank you for the opportunity to care for this patient.  Please do not hesitate to contact me with questions.  Nehemiah Settle, FNP [facility]

## 2020-10-02 ENCOUNTER — Other Ambulatory Visit: Payer: Self-pay

## 2020-10-02 MED ORDER — AZELASTINE HCL 0.1 % NA SOLN: NASAL | 0 refills | Status: DC

## 2020-10-02 MED ORDER — FLUTICASONE PROPIONATE 50 MCG/ACT NA SUSP: NASAL | 0 refills | Status: DC

## 2020-10-02 MED ORDER — MONTELUKAST SODIUM 10 MG PO TABS
10.0000 mg | ORAL_TABLET | Freq: Every day | ORAL | 1 refills | Status: DC
Start: 2020-10-02 — End: 2021-03-11

## 2021-02-25 ENCOUNTER — Ambulatory Visit: Payer: Managed Care, Other (non HMO) | Admitting: Allergy

## 2021-03-10 NOTE — Progress Notes (Signed)
Follow Up Note  RE: Miguel Jordan MRN: [id number] DOB: 08-Aug-1975 Date of Office Visit: 03/11/2021  Referring provider: Ardith Dark, MD Primary care provider: Ardith Dark, MD  Chief Complaint: Follow-up (Pt states overall allergy symptom have improve this season and are well managed.), Allergic Rhinitis , and Cough  History of Present Illness: I had the pleasure of seeing Miguel Jordan for a follow up visit at the [facility] on 03/11/2021. He is a 45 y.o. male, who is being followed for allergic rhinitis, cough. His previous allergy office visit was on 08/20/2020 with Miguel Settle FNP. Today is a regular follow up visit.  Seasonal and perennial allergic rhinitis (grass, weeds, trees, dust mite, cat, dog and tobacco leaf) Usually has flares in the fall but this year patient has been doing better than last fall.   Currently on Xyzal daily and Singulair daily. Patient is not using any nasal sprays right now but has some PND.  Denies any reflux/heartburn symptoms.   He travels a lot for work and no sure if can commit to [place].  Allergic cough Patient had a cough for about 1-2 weeks and has been improving but still coughing throughout the day.  Patient did not use inhaler at all. He has been only using Singulair every other day as he needs new Rx.    Denies any SOB, wheezing, chest tightness, nocturnal awakenings, ER/urgent care visits or prednisone use since the last visit.   Assessment and Plan: Ewell is a 45 y.o. male with: Seasonal and perennial allergic rhinoconjunctivitis Past history - 2021 skin testing positive to grass pollens, tree pollens, molds, dust mites, cat, dog and tobacco leaf. Interim history - usually flares in the spring and summer. Having some PND. Continue environmental control measures. Use over the counter antihistamines such as Zyrtec (cetirizine), Claritin (loratadine), Allegra (fexofenadine), or Xyzal (levocetirizine) daily  as needed. May take twice a day during allergy flares. May switch antihistamines every few months. Restart Singulair (montelukast) 10mg  daily at night. Start Ryaltris (olopatadine + mometasone nasal spray combination) 1-2 sprays per nostril twice a day. Sample given. This replaces your other nasal sprays. Consider allergy injections for long term control if above medications do not help the symptoms - handout given.   Asthma due to seasonal allergies Started with coughing a few weeks ago. This always happens in the fall. Not using any inhalers. Today's spirometry showed: normal pattern with 4% and 170cc  improvement in FEV1 post bronchodilator treatment. Clinically feeling improved and coughing resolved.  Daily controller medication(s): START Alvesco 2 puffs twice a day and rinse mouth after each use. Use this for 1 month.  During coughing flares:  Start Alvesco 2 puffs twice a day for 2 weeks until your breathing symptoms return to baseline.  May use albuterol rescue inhaler 2 puffs every 4 to 6 hours as needed for shortness of breath, chest tightness, coughing, and wheezing. Monitor frequency of use.  Get spirometry at next visit.  Return in about 6 months (around 09/09/2021).  Meds ordered this encounter  Medications   montelukast (SINGULAIR) 10 MG tablet    Sig: Take 1 tablet (10 mg total) by mouth daily.    Dispense:  90 tablet    Refill:  3   levocetirizine (XYZAL) 5 MG tablet    Sig: Take 1 tablet by mouth once daily    Dispense:  90 tablet    Refill:  3   albuterol (PROAIR HFA)  108 (90 Base) MCG/ACT inhaler    Sig: Inhale 2 puffs into the lungs every 4 (four) hours as needed for wheezing or shortness of breath (coughing fits).    Dispense:  18 g    Refill:  1   ciclesonide (ALVESCO) 80 MCG/ACT inhaler    Sig: Inhale 2 puffs into the lungs 2 (two) times daily. Rinse mouth after each use.    Dispense:  1 each    Refill:  2    C: [phone]    Olopatadine-Mometasone (RYALTRIS) 665-25 MCG/ACT SUSP    Sig: Place 1-2 sprays into the nose in the morning and at bedtime.    Dispense:  29 g    Refill:  5    C: [phone]    Lab Orders  No laboratory test(s) ordered today    Diagnostics: Spirometry:  Tracings reviewed. His effort: Good reproducible efforts. FVC: 5.40L FEV1: 4.24L, 110% predicted FEV1/FVC ratio: 79% Interpretation: Spirometry consistent with normal pattern with 4% and 170cc  improvement in FEV1 post bronchodilator treatment. Clinically feeling improved and coughing resolved.   Please see scanned spirometry results for details.  Medication List:  Current Outpatient Medications  Medication Sig Dispense Refill   albuterol (PROAIR HFA) 108 (90 Base) MCG/ACT inhaler Inhale 2 puffs into the lungs every 4 (four) hours as needed for wheezing or shortness of breath (coughing fits). 18 g 1   ciclesonide (ALVESCO) 80 MCG/ACT inhaler Inhale 2 puffs into the lungs 2 (two) times daily. Rinse mouth after each use. 1 each 2   ivermectin (STROMECTOL) 3 MG TABS tablet Take 9.5 tablets (28,500 mcg total) by mouth daily. 48 tablet 0   Olopatadine HCl 0.2 % SOLN 1 drop in each eye once a day as needed for itchy watery eyes 2.5 mL 3   Olopatadine-Mometasone (RYALTRIS) 665-25 MCG/ACT SUSP Place 1-2 sprays into the nose in the morning and at bedtime. 29 g 5   levocetirizine (XYZAL) 5 MG tablet Take 1 tablet by mouth once daily 90 tablet 3   montelukast (SINGULAIR) 10 MG tablet Take 1 tablet (10 mg total) by mouth daily. 90 tablet 3   No current facility-administered medications for this visit.   Allergies: No Known Allergies I reviewed his past medical history, social history, family history, and environmental history and no significant changes have been reported from his previous visit.  Review of Systems  Constitutional:  Negative for appetite change, chills, fever and unexpected weight change.  HENT:  Positive for congestion,  postnasal drip and rhinorrhea.   Eyes:  Negative for itching.  Respiratory:  Positive for cough. Negative for chest tightness, shortness of breath and wheezing.   Gastrointestinal:  Negative for abdominal pain.  Skin:  Negative for rash.  Allergic/Immunologic: Positive for environmental allergies.  Neurological:  Negative for headaches.   Objective: BP 124/78   Pulse 70   Temp 97.8 F (36.6 C) (Temporal)   Resp 18   Ht 6\' 1"  (1.854 m)   Wt 208 lb 12.8 oz (94.7 kg)   SpO2 96%   BMI 27.55 kg/m  Body mass index is 27.55 kg/m. Physical Exam Vitals and nursing note reviewed.  Constitutional:      Appearance: Normal appearance. He is well-developed.  HENT:     Head: Normocephalic and atraumatic.     Right Ear: Tympanic membrane and external ear normal.     Left Ear: Tympanic membrane and external ear normal.     Nose: Nose normal.     Mouth/Throat:  Mouth: Mucous membranes are moist.     Pharynx: Oropharynx is clear.  Eyes:     Conjunctiva/sclera: Conjunctivae normal.  Cardiovascular:     Rate and Rhythm: Normal rate and regular rhythm.     Heart sounds: Normal heart sounds. No murmur heard. Pulmonary:     Effort: Pulmonary effort is normal.     Breath sounds: Normal breath sounds. No wheezing, rhonchi or rales.  Musculoskeletal:     Cervical back: Neck supple.  Skin:    General: Skin is warm.     Findings: No rash.  Neurological:     Mental Status: He is alert and oriented to person, place, and time.  Psychiatric:        Behavior: Behavior normal.   Previous notes and tests were reviewed. The plan was reviewed with the patient/family, and all questions/concerned were addressed.  It was my pleasure to see Theus today and participate in his care. Please feel free to contact me with any questions or concerns.  Sincerely,  Wyline Mood, DO Allergy & Immunology  [facility] office: [phone] [facility] office:  [phone]

## 2021-03-11 ENCOUNTER — Encounter: Payer: Self-pay | Admitting: Allergy

## 2021-03-11 ENCOUNTER — Other Ambulatory Visit: Payer: Self-pay

## 2021-03-11 ENCOUNTER — Ambulatory Visit (INDEPENDENT_AMBULATORY_CARE_PROVIDER_SITE_OTHER): Payer: Managed Care, Other (non HMO) | Admitting: Allergy

## 2021-03-11 VITALS — BP 124/78 | HR 70 | Temp 97.8°F | Resp 18 | Ht 73.0 in | Wt 208.8 lb

## 2021-03-11 DIAGNOSIS — R058 Other specified cough: Secondary | ICD-10-CM | POA: Diagnosis not present

## 2021-03-11 DIAGNOSIS — J302 Other seasonal allergic rhinitis: Secondary | ICD-10-CM | POA: Diagnosis not present

## 2021-03-11 DIAGNOSIS — J3089 Other allergic rhinitis: Secondary | ICD-10-CM

## 2021-03-11 DIAGNOSIS — H1013 Acute atopic conjunctivitis, bilateral: Secondary | ICD-10-CM | POA: Diagnosis not present

## 2021-03-11 DIAGNOSIS — J45909 Unspecified asthma, uncomplicated: Secondary | ICD-10-CM

## 2021-03-11 MED ORDER — RYALTRIS 665-25 MCG/ACT NA SUSP: 1.0000 | Freq: Two times a day (BID) | NASAL | 5 refills | Status: DC

## 2021-03-11 MED ORDER — ALBUTEROL SULFATE HFA 108 (90 BASE) MCG/ACT IN AERS: 2.0000 | INHALATION_SPRAY | RESPIRATORY_TRACT | 1 refills | Status: DC | PRN

## 2021-03-11 MED ORDER — ALVESCO 80 MCG/ACT IN AERS: 2.0000 | INHALATION_SPRAY | Freq: Two times a day (BID) | RESPIRATORY_TRACT | 2 refills | Status: DC

## 2021-03-11 MED ORDER — LEVOCETIRIZINE DIHYDROCHLORIDE 5 MG PO TABS
ORAL_TABLET | ORAL | 3 refills | Status: DC
Start: 2021-03-11 — End: 2023-05-22

## 2021-03-11 MED ORDER — MONTELUKAST SODIUM 10 MG PO TABS
10.0000 mg | ORAL_TABLET | Freq: Every day | ORAL | 3 refills | Status: DC
Start: 2021-03-11 — End: 2022-04-25

## 2021-03-11 NOTE — Assessment & Plan Note (Signed)
Started with coughing a few weeks ago. This always happens in the fall. Not using any inhalers.  Today's spirometry showed: normal pattern with 4% and 170cc  improvement in FEV1 post bronchodilator treatment. Clinically feeling improved and coughing resolved.  . Daily controller medication(s): START Alvesco 2 puffs twice a day and rinse mouth after each use. Use this for 1 month.  . During coughing flares:  o Start Alvesco 2 puffs twice a day for 2 weeks until your breathing symptoms return to baseline.  . May use albuterol rescue inhaler 2 puffs every 4 to 6 hours as needed for shortness of breath, chest tightness, coughing, and wheezing. Monitor frequency of use.  . Get spirometry at next visit.

## 2021-03-11 NOTE — Assessment & Plan Note (Signed)
Past history - 2021 skin testing positive to grass pollens, tree pollens, molds, dust mites, cat, dog and tobacco leaf. Interim history - usually flares in the spring and summer. Having some PND. Marland Kitchen Continue environmental control measures. . Use over the counter antihistamines such as Zyrtec (cetirizine), Claritin (loratadine), Allegra (fexofenadine), or Xyzal (levocetirizine) daily as needed. May take twice a day during allergy flares. May switch antihistamines every few months. Delfin Edis Singulair (montelukast) 10mg  daily at night. . Start Ryaltris (olopatadine + mometasone nasal spray combination) 1-2 sprays per nostril twice a day. Sample given. o This replaces your other nasal sprays. . Consider allergy injections for long term control if above medications do not help the symptoms - handout given.

## 2021-03-11 NOTE — Patient Instructions (Addendum)
Environmental allergies: 2021 skin testing was positive to grass weed, ragweed, trees, mold, dust mites, cat, dog, tobacco. Continue environmental control measures. Use over the counter antihistamines such as Zyrtec (cetirizine), Claritin (loratadine), Allegra (fexofenadine), or Xyzal (levocetirizine) daily as needed. May take twice a day during allergy flares. May switch antihistamines every few months. Restart Singulair (montelukast) 10mg  daily at night. Start Ryaltris (olopatadine + mometasone nasal spray combination) 1-2 sprays per nostril twice a day. Sample given. This replaces your other nasal sprays. Consider allergy injections for long term control if above medications do not help the symptoms - handout given.   Coughing: Monitor symptoms.  Daily controller medication(s): START Alvesco 2 puffs twice a day and rinse mouth after each use. Use this for 1 month.  This will be mailed to you.  During coughing flares:  Start Alvesco 2 puffs twice a day for 2 weeks until your breathing symptoms return to baseline.  May use albuterol rescue inhaler 2 puffs every 4 to 6 hours as needed for shortness of breath, chest tightness, coughing, and wheezing. Monitor frequency of use.  Breathing control goals:  Full participation in all desired activities (may need albuterol before activity) Albuterol use two times or less a week on average (not counting use with activity) Cough interfering with sleep two times or less a month Oral steroids no more than once a year No hospitalizations   Follow up in 6 months or sooner if needed.

## 2021-09-08 NOTE — Progress Notes (Deleted)
Follow Up Note  RE: Miguel Jordan MRN: [id number] DOB: 1975-07-25 Date of Office Visit: 09/09/2021  Referring provider: Ardith Dark, MD Primary care provider: Ardith Dark, MD  Chief Complaint: No chief complaint on file.  History of Present Illness: I had the pleasure of seeing Miguel Jordan for a [facility]  on 09/08/2021. He is a 46 y.o. male, who is being followed for allergic rhinoconjunctivitis, asthma. His previous allergy office visit was on 03/11/2021 with Dr. Selena Batten. Today is a regular follow up visit.  Seasonal and perennial allergic rhinoconjunctivitis Past history - 2021 skin testing positive to grass pollens, tree pollens, molds, dust mites, cat, dog and tobacco leaf. Interim history - usually flares in the spring and summer. Having some PND. Continue environmental control measures. Use over the counter antihistamines such as Zyrtec (cetirizine), Claritin (loratadine), Allegra (fexofenadine), or Xyzal (levocetirizine) daily as needed. May take twice a day during allergy flares. May switch antihistamines every few months. Restart Singulair (montelukast) 10mg  daily at night. Start Ryaltris (olopatadine + mometasone nasal spray combination) 1-2 sprays per nostril twice a day. Sample given. This replaces your other nasal sprays. Consider allergy injections for long term control if above medications do not help the symptoms - handout given.    Asthma due to seasonal allergies Started with coughing a few weeks ago. This always happens in the fall. Not using any inhalers. Today's spirometry showed: normal pattern with 4% and 170cc  improvement in FEV1 post bronchodilator treatment. Clinically feeling improved and coughing resolved.  Daily controller medication(s): START Alvesco 2 puffs twice a day and rinse mouth after each use. Use this for 1 month.  During coughing flares:  Start Alvesco 2 puffs twice a day for 2  weeks until your breathing symptoms return to baseline.  May use albuterol rescue inhaler 2 puffs every 4 to 6 hours as needed for shortness of breath, chest tightness, coughing, and wheezing. Monitor frequency of use.  Get spirometry at next visit.   Return in about 6 months (around 09/09/2021).  Assessment and Plan: Miguel Jordan is a 46 y.o. male with: No problem-specific Assessment & Plan notes found for this encounter.  No follow-ups on file.  No orders of the defined types were placed in this encounter.  Lab Orders  No laboratory test(s) ordered today    Diagnostics: Spirometry:  Tracings reviewed. His effort: {Blank single:19197::"Good reproducible efforts.","It was hard to get consistent efforts and there is a question as to whether this reflects a maximal maneuver.","Poor effort, data can not be interpreted."} FVC: ***L FEV1: ***L, ***% predicted FEV1/FVC ratio: ***% Interpretation: {Blank single:19197::"Spirometry consistent with mild obstructive disease","Spirometry consistent with moderate obstructive disease","Spirometry consistent with severe obstructive disease","Spirometry consistent with possible restrictive disease","Spirometry consistent with mixed obstructive and restrictive disease","Spirometry uninterpretable due to technique","Spirometry consistent with normal pattern","No overt abnormalities noted given today's efforts"}.  Please see scanned spirometry results for details.  Skin Testing: {Blank single:19197::"Select foods","Environmental allergy panel","Environmental allergy panel and select foods","Food allergy panel","None","Deferred due to recent antihistamines use"}. *** Results discussed with patient/family.   Medication List:  Current Outpatient Medications  Medication Sig Dispense Refill  . albuterol (PROAIR HFA) 108 (90 Base) MCG/ACT inhaler Inhale 2 puffs into the lungs every 4 (four) hours as needed for wheezing or shortness of breath (coughing fits). 18  g 1  . ciclesonide (ALVESCO) 80 MCG/ACT inhaler Inhale 2 puffs into the lungs 2 (two) times daily. Rinse mouth after each  use. 1 each 2  . ivermectin (STROMECTOL) 3 MG TABS tablet Take 9.5 tablets (28,500 mcg total) by mouth daily. 48 tablet 0  . levocetirizine (XYZAL) 5 MG tablet Take 1 tablet by mouth once daily 90 tablet 3  . montelukast (SINGULAIR) 10 MG tablet Take 1 tablet (10 mg total) by mouth daily. 90 tablet 3  . Olopatadine HCl 0.2 % SOLN 1 drop in each eye once a day as needed for itchy watery eyes 2.5 mL 3  . Olopatadine-Mometasone (RYALTRIS) [id number] MCG/ACT SUSP Place 1-2 sprays into the nose in the morning and at bedtime. 29 g 5   No current facility-administered medications for this visit.   Allergies: No Known Allergies I reviewed his past medical history, social history, family history, and environmental history and no significant changes have been reported from his previous visit.  Review of Systems  Constitutional:  Negative for appetite change, chills, fever and unexpected weight change.  HENT:  Positive for congestion, postnasal drip and rhinorrhea.   Eyes:  Negative for itching.  Respiratory:  Positive for cough. Negative for chest tightness, shortness of breath and wheezing.   Gastrointestinal:  Negative for abdominal pain.  Skin:  Negative for rash.  Allergic/Immunologic: Positive for environmental allergies.  Neurological:  Negative for headaches.   Objective: There were no vitals taken for this visit. There is no height or weight on file to calculate BMI. Physical Exam Vitals and nursing note reviewed.  Constitutional:      Appearance: Normal appearance. He is well-developed.  HENT:     Head: Normocephalic and atraumatic.     Right Ear: Tympanic membrane and external ear normal.     Left Ear: Tympanic membrane and external ear normal.     Nose: Nose normal.     Mouth/Throat:     Mouth: Mucous membranes are moist.     Pharynx: Oropharynx is clear.   Eyes:     Conjunctiva/sclera: Conjunctivae normal.  Cardiovascular:     Rate and Rhythm: Normal rate and regular rhythm.     Heart sounds: Normal heart sounds. No murmur heard. Pulmonary:     Effort: Pulmonary effort is normal.     Breath sounds: Normal breath sounds. No wheezing, rhonchi or rales.  Musculoskeletal:     Cervical back: Neck supple.  Skin:    General: Skin is warm.     Findings: No rash.  Neurological:     Mental Status: He is alert and oriented to person, place, and time.  Psychiatric:        Behavior: Behavior normal.  Previous notes and tests were reviewed. The plan was reviewed with the patient/family, and all questions/concerned were addressed.  It was my pleasure to see Anna today and participate in his care. Please feel free to contact me with any questions or concerns.  Sincerely,  Wyline Mood, DO Allergy & Immunology  [facility] office: [phone] [facility] office: [phone]

## 2021-09-09 ENCOUNTER — Ambulatory Visit: Payer: Self-pay | Admitting: Allergy

## 2021-09-09 DIAGNOSIS — J309 Allergic rhinitis, unspecified: Secondary | ICD-10-CM

## 2021-09-09 DIAGNOSIS — J45909 Unspecified asthma, uncomplicated: Secondary | ICD-10-CM

## 2021-09-09 DIAGNOSIS — H1010 Acute atopic conjunctivitis, unspecified eye: Secondary | ICD-10-CM

## 2022-02-07 ENCOUNTER — Encounter: Payer: Self-pay | Admitting: *Deleted

## 2022-02-21 ENCOUNTER — Other Ambulatory Visit: Payer: Self-pay | Admitting: Allergy

## 2022-04-24 NOTE — Progress Notes (Unsigned)
Follow Up Note  RE: Miguel Jordan MRN: [id number] DOB: 21-Feb-1976 Date of Office Visit: 04/25/2022  Referring provider: Ardith Dark, MD Primary care provider: Ardith Dark, MD  Chief Complaint: No chief complaint on file.  History of Present Illness: I had the pleasure of seeing Miguel Jordan for a follow up visit at [facility] [place] on 04/24/2022. He is a 46 y.o. male, who is being followed for allergic rhinoconjunctivitis and asthma. His previous allergy office visit was on 03/11/2021 with Dr. Selena Jordan. Today is a regular follow up visit.  Seasonal and perennial allergic rhinoconjunctivitis Past history - 2021 skin testing positive to grass pollens, tree pollens, molds, dust mites, cat, dog and tobacco leaf. Interim history - usually flares in the spring and summer. Having some PND. Continue environmental control measures. Use over the counter antihistamines such as Zyrtec (cetirizine), Claritin (loratadine), Allegra (fexofenadine), or Xyzal (levocetirizine) daily as needed. May take twice a day during allergy flares. May switch antihistamines every few months. Restart Singulair (montelukast) 10mg  daily at night. Start Ryaltris (olopatadine + mometasone nasal spray combination) 1-2 sprays per nostril twice a day. Sample given. This replaces your other nasal sprays. Consider allergy injections for long term control if above medications do not help the symptoms - handout given.    Asthma due to seasonal allergies Started with coughing a few weeks ago. This always happens in the fall. Not using any inhalers. Today's spirometry showed: normal pattern with 4% and 170cc  improvement in FEV1 post bronchodilator treatment. Clinically feeling improved and coughing resolved.  Daily controller medication(s): START Alvesco 2 puffs twice a day and rinse mouth after each use. Use this for 1 month.  During coughing flares:  Start Alvesco 2 puffs twice a day for 2  weeks until your breathing symptoms return to baseline.  May use albuterol rescue inhaler 2 puffs every 4 to 6 hours as needed for shortness of breath, chest tightness, coughing, and wheezing. Monitor frequency of use.  Get spirometry at next visit.   Return in about 6 months (around 09/09/2021).  Assessment and Plan: Miguel Jordan is a 46 y.o. male with: No problem-specific Assessment & Plan notes found for this encounter.  No follow-ups on file.  No orders of the defined types were placed in this encounter.  Lab Orders  No laboratory test(s) ordered today    Diagnostics: Spirometry:  Tracings reviewed. His effort: {Blank single:19197::"Good reproducible efforts.","It was hard to get consistent efforts and there is a question as to whether this reflects a maximal maneuver.","Poor effort, data can not be interpreted."} FVC: ***L FEV1: ***L, ***% predicted FEV1/FVC ratio: ***% Interpretation: {Blank single:19197::"Spirometry consistent with mild obstructive disease","Spirometry consistent with moderate obstructive disease","Spirometry consistent with severe obstructive disease","Spirometry consistent with possible restrictive disease","Spirometry consistent with mixed obstructive and restrictive disease","Spirometry uninterpretable due to technique","Spirometry consistent with normal pattern","No overt abnormalities noted given today's efforts"}.  Please see scanned spirometry results for details.  Skin Testing: {Blank single:19197::"Select foods","Environmental allergy panel","Environmental allergy panel and select foods","Food allergy panel","None","Deferred due to recent antihistamines use"}. *** Results discussed with patient/family.   Medication List:  Current Outpatient Medications  Medication Sig Dispense Refill  . albuterol (PROAIR HFA) 108 (90 Base) MCG/ACT inhaler Inhale 2 puffs into the lungs every 4 (four) hours as needed for wheezing or shortness of breath (coughing fits). 18  g 1  . ciclesonide (ALVESCO) 80 MCG/ACT inhaler Inhale 2 puffs into the lungs 2 (two) times daily. Rinse mouth after  each use. 1 each 2  . ivermectin (STROMECTOL) 3 MG TABS tablet Take 9.5 tablets (28,500 mcg total) by mouth daily. 48 tablet 0  . levocetirizine (XYZAL) 5 MG tablet Take 1 tablet by mouth once daily 90 tablet 3  . montelukast (SINGULAIR) 10 MG tablet Take 1 tablet (10 mg total) by mouth daily. 90 tablet 3  . Olopatadine HCl 0.2 % SOLN 1 drop in each eye once a day as needed for itchy watery eyes 2.5 mL 3  . Olopatadine-Mometasone (RYALTRIS) [id number] MCG/ACT SUSP Place 1-2 sprays into the nose in the morning and at bedtime. 29 g 5   No current facility-administered medications for this visit.   Allergies: No Known Allergies I reviewed his past medical history, social history, family history, and environmental history and no significant changes have been reported from his previous visit.  Review of Systems  Constitutional:  Negative for appetite change, chills, fever and unexpected weight change.  HENT:  Positive for congestion, postnasal drip and rhinorrhea.   Eyes:  Negative for itching.  Respiratory:  Positive for cough. Negative for chest tightness, shortness of breath and wheezing.   Gastrointestinal:  Negative for abdominal pain.  Skin:  Negative for rash.  Allergic/Immunologic: Positive for environmental allergies.  Neurological:  Negative for headaches.   Objective: There were no vitals taken for this visit. There is no height or weight on file to calculate BMI. Physical Exam Vitals and nursing note reviewed.  Constitutional:      Appearance: Normal appearance. He is well-developed.  HENT:     Head: Normocephalic and atraumatic.     Right Ear: Tympanic membrane and external ear normal.     Left Ear: Tympanic membrane and external ear normal.     Nose: Nose normal.     Mouth/Throat:     Mouth: Mucous membranes are moist.     Pharynx: Oropharynx is clear.   Eyes:     Conjunctiva/sclera: Conjunctivae normal.  Cardiovascular:     Rate and Rhythm: Normal rate and regular rhythm.     Heart sounds: Normal heart sounds. No murmur heard. Pulmonary:     Effort: Pulmonary effort is normal.     Breath sounds: Normal breath sounds. No wheezing, rhonchi or rales.  Musculoskeletal:     Cervical back: Neck supple.  Skin:    General: Skin is warm.     Findings: No rash.  Neurological:     Mental Status: He is alert and oriented to person, place, and time.  Psychiatric:        Behavior: Behavior normal.  Previous notes and tests were reviewed. The plan was reviewed with the patient/family, and all questions/concerned were addressed.  It was my pleasure to see Genie today and participate in his care. Please feel free to contact me with any questions or concerns.  Sincerely,  Rexene Alberts, DO Allergy & Immunology  [facility] office: [place] office: [phone]

## 2022-04-25 ENCOUNTER — Ambulatory Visit (INDEPENDENT_AMBULATORY_CARE_PROVIDER_SITE_OTHER): Payer: Managed Care, Other (non HMO) | Admitting: Allergy

## 2022-04-25 ENCOUNTER — Encounter: Payer: Self-pay | Admitting: Allergy

## 2022-04-25 VITALS — BP 100/60 | HR 60 | Temp 98.0°F

## 2022-04-25 DIAGNOSIS — J45909 Unspecified asthma, uncomplicated: Secondary | ICD-10-CM

## 2022-04-25 DIAGNOSIS — H1010 Acute atopic conjunctivitis, unspecified eye: Secondary | ICD-10-CM

## 2022-04-25 DIAGNOSIS — J4520 Mild intermittent asthma, uncomplicated: Secondary | ICD-10-CM

## 2022-04-25 DIAGNOSIS — J302 Other seasonal allergic rhinitis: Secondary | ICD-10-CM | POA: Diagnosis not present

## 2022-04-25 DIAGNOSIS — H1013 Acute atopic conjunctivitis, bilateral: Secondary | ICD-10-CM | POA: Diagnosis not present

## 2022-04-25 MED ORDER — MONTELUKAST SODIUM 10 MG PO TABS: 10.0000 mg | ORAL_TABLET | Freq: Every day | ORAL | 3 refills | Status: DC

## 2022-04-25 MED ORDER — AIRSUPRA 90-80 MCG/ACT IN AERO: 2.0000 | INHALATION_SPRAY | RESPIRATORY_TRACT | 2 refills | Status: DC | PRN

## 2022-04-25 NOTE — Assessment & Plan Note (Signed)
Interestingly coughing started about 1 week ago and usually gets coughing in September.  Today's spirometry was normal.  Monitor symptoms.  May use Airsupra rescue inhaler 2 puffs every 4 to 6 hours as needed for shortness of breath, chest tightness, coughing, and wheezing. Do not use more than 12 puffs in 24 hours. May use Airsupra rescue inhaler 2 puffs 5 to 15 minutes prior to strenuous physical activities. Monitor frequency of use. Rinse mouth after each use. Sample given.  Coupon given.

## 2022-04-25 NOTE — Assessment & Plan Note (Signed)
Past history - 2021 skin testing positive to grass pollens, tree pollens, molds, dust mites, cat, dog and tobacco leaf. Interim history - doing well this fall. Only taking Singulair daily. Continue environmental control measures. Use over the counter antihistamines such as Zyrtec (cetirizine), Claritin (loratadine), Allegra (fexofenadine), or Xyzal (levocetirizine) daily as needed. May take twice a day during allergy flares. May switch antihistamines every few months. Continue Singulair (montelukast) 10mg  daily at night. May use Ryaltris (olopatadine + mometasone nasal spray combination) 1-2 sprays per nostril twice a day. Consider allergy injections for long term control if above medications do not help the symptoms.

## 2022-04-25 NOTE — Patient Instructions (Signed)
Environmental allergies: 2021 skin testing was positive to grass weed, ragweed, trees, mold, dust mites, cat, dog, tobacco. Continue environmental control measures. Use over the counter antihistamines such as Zyrtec (cetirizine), Claritin (loratadine), Allegra (fexofenadine), or Xyzal (levocetirizine) daily as needed. May take twice a day during allergy flares. May switch antihistamines every few months. Continue Singulair (montelukast) 10mg  daily at night. May use Ryaltris (olopatadine + mometasone nasal spray combination) 1-2 sprays per nostril twice a day. Consider allergy injections for long term control if above medications do not help the symptoms.   Coughing: Monitor symptoms.  Normal breathing test today. May use Airsupra rescue inhaler 2 puffs every 4 to 6 hours as needed for shortness of breath, chest tightness, coughing, and wheezing. Do not use more than 12 puffs in 24 hours. May use Airsupra rescue inhaler 2 puffs 5 to 15 minutes prior to strenuous physical activities. Monitor frequency of use. Rinse mouth after each use. Sample given.  Coupon given.  Follow up in 12 months or sooner if needed.

## 2022-04-28 ENCOUNTER — Encounter: Payer: Self-pay | Admitting: *Deleted

## 2022-06-03 ENCOUNTER — Other Ambulatory Visit: Payer: Self-pay | Admitting: *Deleted

## 2022-06-03 ENCOUNTER — Ambulatory Visit (INDEPENDENT_AMBULATORY_CARE_PROVIDER_SITE_OTHER): Payer: Managed Care, Other (non HMO) | Admitting: Family Medicine

## 2022-06-03 ENCOUNTER — Encounter: Payer: Self-pay | Admitting: Family Medicine

## 2022-06-03 ENCOUNTER — Telehealth: Payer: Self-pay | Admitting: Family Medicine

## 2022-06-03 ENCOUNTER — Telehealth: Payer: Self-pay | Admitting: *Deleted

## 2022-06-03 VITALS — BP 121/82 | HR 64 | Temp 97.5°F | Ht 73.0 in | Wt 211.0 lb

## 2022-06-03 DIAGNOSIS — J3089 Other allergic rhinitis: Secondary | ICD-10-CM | POA: Diagnosis not present

## 2022-06-03 DIAGNOSIS — J45909 Unspecified asthma, uncomplicated: Secondary | ICD-10-CM

## 2022-06-03 DIAGNOSIS — J302 Other seasonal allergic rhinitis: Secondary | ICD-10-CM

## 2022-06-03 DIAGNOSIS — E785 Hyperlipidemia, unspecified: Secondary | ICD-10-CM

## 2022-06-03 DIAGNOSIS — H1010 Acute atopic conjunctivitis, unspecified eye: Secondary | ICD-10-CM

## 2022-06-03 MED ORDER — TYPHOID VACCINE PO CPDR: 1.0000 | DELAYED_RELEASE_CAPSULE | ORAL | 0 refills | Status: DC

## 2022-06-03 MED ORDER — ZOLPIDEM TARTRATE 10 MG PO TABS: 10.0000 mg | ORAL_TABLET | Freq: Every evening | ORAL | 0 refills | Status: DC | PRN

## 2022-06-03 MED ORDER — ATOVAQUONE-PROGUANIL HCL 62.5-25 MG PO TABS: 1.0000 | ORAL_TABLET | Freq: Every day | ORAL | 0 refills | Status: DC

## 2022-06-03 MED ORDER — AZITHROMYCIN 500 MG PO TABS: 500.0000 mg | ORAL_TABLET | Freq: Every day | ORAL | 0 refills | Status: DC

## 2022-06-03 MED ORDER — NIRMATRELVIR/RITONAVIR (PAXLOVID)TABLET: 3.0000 | ORAL_TABLET | Freq: Two times a day (BID) | ORAL | 0 refills | Status: AC

## 2022-06-03 MED ORDER — NIRMATRELVIR/RITONAVIR (PAXLOVID)TABLET: 3.0000 | ORAL_TABLET | Freq: Two times a day (BID) | ORAL | 0 refills | Status: DC

## 2022-06-03 NOTE — Assessment & Plan Note (Signed)
Mild flare recently.  Following with allergy for this.  He is on Singulair.

## 2022-06-03 NOTE — Telephone Encounter (Signed)
Medication send to pharmacy with correction

## 2022-06-03 NOTE — Telephone Encounter (Signed)
Pharmacist states she needs to know whether paxlovid needs to be standard or renal. Requests a callback from [facility] @ [phone].

## 2022-06-03 NOTE — Patient Instructions (Addendum)
It was very nice to see you today!  I will send in a prescription for the typhoid vaccine to the pharmacy.  If they do not have this please check with the health department.  I will send in a prescription for malaria prophylaxis with Malarone.  Please start a couple days before your trip and then continue for a week after you get back  I will send in a small supply of Ambien to help you with the jet lag and sleep.  Please use azithromycin if you have any traveler's diarrhea.  Start paxlovid if you get COVID.  Please let me know when you are ready for your colonoscopy.  We will see you back later this year for your physical.  Take care, Dr Jerline Pain  PLEASE NOTE:  If you had any lab tests, please let us know if you have not heard back within a few days. You may see your results on mychart before we have a chance to review them but we will give you a call once they are reviewed by [place].   If we ordered any referrals today, please let us know if you have not heard from their office within the next week.   If you had any urgent prescriptions sent in today, please check with the pharmacy within an hour of our visit to make sure the prescription was transmitted appropriately.   Please try these tips to maintain a healthy lifestyle:  Eat at least 3 REAL meals and 1-2 snacks per day.  Aim for no more than 5 hours between eating.  If you eat breakfast, please do so within one hour of getting up.   Each meal should contain half fruits/vegetables, one quarter protein, and one quarter carbs (no bigger than a computer mouse)  Cut down on sweet beverages. This includes juice, soda, and sweet tea.   Drink at least 1 glass of water with each meal and aim for at least 8 glasses per day  Exercise at least 150 minutes every week.

## 2022-06-03 NOTE — Assessment & Plan Note (Signed)
Recently at a cardiac CT scan which showed calcium score of 0.  He has been following with the [facility] for ongoing monitoring and management for this.  We can discuss rechecking lipids at his next CPE.

## 2022-06-03 NOTE — Progress Notes (Signed)
   Miguel Jordan is a 47 y.o. male who presents today for an office visit.  Assessment/Plan:  New/Acute Problems: Encounter Travel Advice Up-to-date on routine vaccines.  We will send a prescription into his pharmacy for typhoid vaccine though advised patient he may need to go to the health department to have this done.  Will send in pocket prescriptions for azithromycin to use as needed for traveler's diarrhea and paxlovid use as needed for COVID.  Will start malaria prophylaxis with Malarone.  Will also send in a small supply ambien to use as needed for jet lag.  He has had this before and has done well with this.  No significant side effects in the past.  We did discuss potential side effects.  Chronic Problems Addressed Today: Allergic rhinitis Mild flare recently.  Following with allergy for this.  He is on Singulair.   Dyslipidemia Recently at a cardiac CT scan which showed calcium score of 0.  He has been following with the [facility] for ongoing monitoring and management for this.  We can discuss rechecking lipids at his next CPE.  Preventative Healthcare He is due for colonoscopy.  He would like to complete the above trips first before scheduling this.  He will reach out to me when he would like for [place] to place a referral.    Subjective:  HPI:  See A/P for status of chronic conditions.  Will be going to be [place] in a week and wants to make sure he is up-to-date on vaccines and has any prescriptions on hand to use as needed.  Will be staying in large cities.  Going for work.  He thinks he is up-to-date on his routine vaccines including hepatitis, MMR, and polio.   Has had more issues with allergies recently.  Recently saw allergist and was started on new inhaler.  He has had some modest improvement with this.      Objective:  Physical Exam: BP 121/82   Pulse 64   Temp (!) 97.5 F (36.4 C) (Temporal)   Ht 6\' 1"  (1.854 m)   Wt 211 lb (95.7 kg)   SpO2 95%   BMI 27.84  kg/m   Gen: No acute distress, resting comfortably CV: Regular rate and rhythm with no murmurs appreciated Pulm: Normal work of breathing, clear to auscultation bilaterally with no crackles, wheezes, or rhonchi Neuro: Grossly normal, moves all extremities Psych: Normal affect and thought content      Nikolaj Geraghty M. Jerline Pain, MD 06/03/2022 12:08 PM

## 2022-06-06 NOTE — Telephone Encounter (Signed)
Rx resend to pharmacy 

## 2023-04-03 ENCOUNTER — Other Ambulatory Visit: Payer: Self-pay | Admitting: Allergy

## 2023-05-04 ENCOUNTER — Telehealth: Payer: Self-pay | Admitting: Allergy

## 2023-05-04 NOTE — Telephone Encounter (Signed)
Miguel Jordan called in and requested refills on Montelukast.  I informed him he was due for an appointment as it has been a year since he was last seen.  Miguel Jordan made an appointment with Dr. Selena Batten for 05/22/2023.  He would like the refill sent to [facility] in [facility]

## 2023-05-04 NOTE — Telephone Encounter (Deleted)
Miguel Jordan

## 2023-05-04 NOTE — Telephone Encounter (Signed)
I called patient and left a message to call the office back to verify which [facility] in [place].

## 2023-05-05 ENCOUNTER — Other Ambulatory Visit (HOSPITAL_BASED_OUTPATIENT_CLINIC_OR_DEPARTMENT_OTHER): Payer: Self-pay | Admitting: Orthopedic Surgery

## 2023-05-05 DIAGNOSIS — R5382 Chronic fatigue, unspecified: Secondary | ICD-10-CM

## 2023-05-08 MED ORDER — MONTELUKAST SODIUM 10 MG PO TABS: 10.0000 mg | ORAL_TABLET | Freq: Every day | ORAL | 0 refills | Status: DC

## 2023-05-08 NOTE — Telephone Encounter (Signed)
Sent in refills of montelukast into the [facility] in [place]. Called and informed patient.

## 2023-05-08 NOTE — Addendum Note (Signed)
Addended by: Florence Canner on: 05/08/2023 09:46 AM   Modules accepted: Orders

## 2023-05-08 NOTE — Telephone Encounter (Signed)
Please Send to [facility] in [place]  on [address]

## 2023-05-11 ENCOUNTER — Telehealth (HOSPITAL_BASED_OUTPATIENT_CLINIC_OR_DEPARTMENT_OTHER): Payer: Self-pay | Admitting: Orthopedic Surgery

## 2023-05-21 NOTE — Progress Notes (Signed)
 Follow Up Note  RE: Miguel Jordan MRN: [id number] DOB: 1975/07/02 Date of Office Visit: 05/22/2023  Referring provider: Kennyth Worth HERO, MD Primary care provider: Kennyth Worth HERO, MD  Chief Complaint: Allergies  History of Present Illness: I had the pleasure of seeing Miguel Jordan for a follow up visit at the [facility] on 05/22/2023. He is a 48 y.o. male, who is being followed for allergic rhinoconjunctivitis and asthma. His previous allergy  office visit was on 04/25/2022 with Dr. Luke. Today is a regular follow up visit.  Discussed the use of AI scribe software for clinical note transcription with the patient, who gave verbal consent to proceed.  The patient reports that the past year has been one of his better years in terms of symptom control. He experienced a significant outbreak of his allergy -asthma cough in January of the previous year, but the fall season was relatively mild. He has not been using any inhalers or nasal sprays, relying solely on Singulair  for part of the year. However, he ran out of this medication approximately two weeks prior to the visit and has noticed an increase in sniffles since then. He denies any side effects from the Singulair .  The patient also used levocetirizine for about half of the year until his prescription ran out. He does not require eye drops. He has inhalers at home, but they are likely outdated as they have not been used since the previous February when the patient was traveling internationally.  The patient also reports a skin rash incident that occurred while he was in the [place]. He was diagnosed with contact dermatitis and treated with a steroid cream and an antihistamine. The rash has since resolved. The patient's wife changed detergents as a precautionary measure.   Assessment and Plan: Miguel Jordan is a 48 y.o. male with: Seasonal allergic rhinitis due to pollen Allergic rhinitis due to animal dander Allergic  rhinitis due to dust mite Allergic rhinitis due to mold Allergic conjunctivitis of both eyes Past history - 2021 skin testing positive to grass pollens, tree pollens, molds, dust mites, cat, dog and tobacco leaf. Interim history - Improved symptoms over the past year. Currently only on Singulair , but has run out. No side effects reported from Singulair . Continue environmental control measures. Use over the counter antihistamines such as Zyrtec  (cetirizine ), Claritin  (loratadine ), Allegra (fexofenadine), or Xyzal  (levocetirizine) daily as needed. May take twice a day during allergy  flares. May switch antihistamines every few months. Restart Singulair  (montelukast ) 10mg  daily at night. May use dymista  (fluticasone  + azelastine  nasal spray combination) 1 spray per nostril twice a day. This replaces your other nasal sprays. If it's not covered let us  know.  Nasal saline spray (i.e., Simply Saline) or nasal saline lavage (i.e., NeilMed) is recommended as needed and prior to medicated nasal sprays.   Mild intermittent asthma without complication No recent exacerbations requiring use of inhalers. Last use of inhalers was during international travel due to poor air quality. Today's spirometry was normal.  Monitor symptoms.  If you have issues with the coughing - make appointment for acute visit evaluation. May use Airsupra  rescue inhaler 2 puffs every 4 to 6 hours as needed for shortness of breath, chest tightness, coughing, and wheezing. Do not use more than 12 puffs in 24 hours. May use Airsupra  rescue inhaler 2 puffs 5 to 15 minutes prior to strenuous physical activities. Rinse mouth after each use. Coupon given.  Monitor frequency of use - if you need to use  it more than twice per week on a consistent basis let us  know.    Return in about 1 year (around 05/21/2024).  Meds ordered this encounter  Medications   montelukast  (SINGULAIR ) 10 MG tablet    Sig: Take 1 tablet (10 mg total) by mouth at  bedtime.    Dispense:  90 tablet    Refill:  3   levocetirizine (XYZAL ) 5 MG tablet    Sig: Take 1 tablet (5 mg total) by mouth every evening.    Dispense:  90 tablet    Refill:  3   Azelastine -Fluticasone  137-50 MCG/ACT SUSP    Sig: Place 1 spray into the nose in the morning and at bedtime.    Dispense:  23 g    Refill:  5   Albuterol -Budesonide  (AIRSUPRA ) 90-80 MCG/ACT AERO    Sig: Inhale 2 puffs into the lungs every 4 (four) hours as needed (coughing, wheezing, chest tightness). Do not exceed 12 puffs in 24 hours.    Dispense:  10.7 g    Refill:  2    BIN: [id number], PCN: [id number], GRP: [id number], ID [id number]   Lab Orders  No laboratory test(s) ordered today    Diagnostics: Spirometry:  Tracings reviewed. His effort: Good reproducible efforts. FVC: 6.27L FEV1: 4.71L, 116% predicted FEV1/FVC ratio: 75% Interpretation: Spirometry consistent with normal pattern.  Please see scanned spirometry results for details.  Medication List:  Current Outpatient Medications  Medication Sig Dispense Refill   Albuterol -Budesonide  (AIRSUPRA ) 90-80 MCG/ACT AERO Inhale 2 puffs into the lungs every 4 (four) hours as needed (coughing, wheezing, chest tightness). Do not exceed 12 puffs in 24 hours. 10.7 g 2   Azelastine -Fluticasone  137-50 MCG/ACT SUSP Place 1 spray into the nose in the morning and at bedtime. 23 g 5   levocetirizine (XYZAL ) 5 MG tablet Take 1 tablet (5 mg total) by mouth every evening. 90 tablet 3   montelukast  (SINGULAIR ) 10 MG tablet Take 1 tablet (10 mg total) by mouth at bedtime. 90 tablet 3   No current facility-administered medications for this visit.   Allergies: No Known Allergies I reviewed his past medical history, social history, family history, and environmental history and no significant changes have been reported from his previous visit.  Review of Systems  Constitutional:  Negative for appetite change, chills, fever and unexpected weight change.  HENT:  Negative  for congestion, postnasal drip and rhinorrhea.   Eyes:  Negative for itching.  Respiratory:  Positive for cough. Negative for chest tightness, shortness of breath and wheezing.   Gastrointestinal:  Negative for abdominal pain.  Skin:  Negative for rash.  Allergic/Immunologic: Positive for environmental allergies.  Neurological:  Negative for headaches.    Objective: BP 110/68 (BP Location: Right Arm, Patient Position: Sitting, Cuff Size: Normal)   Pulse 60   Temp 98.1 F (36.7 C) (Temporal)   Resp 18   Ht 5' 11 (1.803 m)   Wt 211 lb 11.2 oz (96 kg)   SpO2 95%   BMI 29.53 kg/m  Body mass index is 29.53 kg/m. Physical Exam Vitals and nursing note reviewed.  Constitutional:      Appearance: Normal appearance. He is well-developed.  HENT:     Head: Normocephalic and atraumatic.     Right Ear: Tympanic membrane and external ear normal.     Left Ear: Tympanic membrane and external ear normal.     Nose: Nose normal.     Mouth/Throat:     Mouth: Mucous membranes are moist.  Pharynx: Oropharynx is clear.  Eyes:     Conjunctiva/sclera: Conjunctivae normal.  Cardiovascular:     Rate and Rhythm: Normal rate and regular rhythm.     Heart sounds: Normal heart sounds. No murmur heard. Pulmonary:     Effort: Pulmonary effort is normal.     Breath sounds: Normal breath sounds. No wheezing, rhonchi or rales.  Musculoskeletal:     Cervical back: Neck supple.  Skin:    General: Skin is warm.     Findings: No rash.  Neurological:     Mental Status: He is alert and oriented to person, place, and time.  Psychiatric:        Behavior: Behavior normal.    Previous notes and tests were reviewed. The plan was reviewed with the patient/family, and all questions/concerned were addressed.  It was my pleasure to see Miguel Jordan today and participate in his care. Please feel free to contact me with any questions or concerns.  Sincerely,  Miguel Cramp, DO Allergy  & Immunology  [facility]  [place] office: [phone] [facility] office: [phone]

## 2023-05-22 ENCOUNTER — Encounter: Payer: Self-pay | Admitting: Allergy

## 2023-05-22 ENCOUNTER — Ambulatory Visit (INDEPENDENT_AMBULATORY_CARE_PROVIDER_SITE_OTHER): Payer: Managed Care, Other (non HMO) | Admitting: Allergy

## 2023-05-22 ENCOUNTER — Other Ambulatory Visit: Payer: Self-pay

## 2023-05-22 VITALS — BP 110/68 | HR 60 | Temp 98.1°F | Resp 18 | Ht 71.0 in | Wt 211.7 lb

## 2023-05-22 DIAGNOSIS — J3089 Other allergic rhinitis: Secondary | ICD-10-CM

## 2023-05-22 DIAGNOSIS — J45909 Unspecified asthma, uncomplicated: Secondary | ICD-10-CM

## 2023-05-22 DIAGNOSIS — J3081 Allergic rhinitis due to animal (cat) (dog) hair and dander: Secondary | ICD-10-CM

## 2023-05-22 DIAGNOSIS — J4520 Mild intermittent asthma, uncomplicated: Secondary | ICD-10-CM | POA: Diagnosis not present

## 2023-05-22 DIAGNOSIS — J301 Allergic rhinitis due to pollen: Secondary | ICD-10-CM

## 2023-05-22 DIAGNOSIS — H1013 Acute atopic conjunctivitis, bilateral: Secondary | ICD-10-CM | POA: Diagnosis not present

## 2023-05-22 MED ORDER — MONTELUKAST SODIUM 10 MG PO TABS: 10.0000 mg | ORAL_TABLET | Freq: Every day | ORAL | 3 refills | Status: DC

## 2023-05-22 MED ORDER — AZELASTINE-FLUTICASONE 137-50 MCG/ACT NA SUSP: 1.0000 | Freq: Two times a day (BID) | NASAL | 5 refills | Status: AC

## 2023-05-22 MED ORDER — LEVOCETIRIZINE DIHYDROCHLORIDE 5 MG PO TABS: 5.0000 mg | ORAL_TABLET | Freq: Every evening | ORAL | 3 refills | Status: AC

## 2023-05-22 MED ORDER — AIRSUPRA 90-80 MCG/ACT IN AERO: 2.0000 | INHALATION_SPRAY | RESPIRATORY_TRACT | 2 refills | Status: AC | PRN

## 2023-05-22 NOTE — Patient Instructions (Addendum)
 Environmental allergies 2021 skin testing positive to grass weed, ragweed, trees, mold, dust mites, cat, dog, tobacco. Continue environmental control measures. Use over the counter antihistamines such as Zyrtec  (cetirizine ), Claritin  (loratadine ), Allegra (fexofenadine), or Xyzal  (levocetirizine) daily as needed. May take twice a day during allergy  flares. May switch antihistamines every few months. Restart Singulair  (montelukast ) 10mg  daily at night. May use dymista  (fluticasone  + azelastine  nasal spray combination) 1 spray per nostril twice a day. This replaces your other nasal sprays. If it's not covered let us  know.  Nasal saline spray (i.e., Simply Saline) or nasal saline lavage (i.e., NeilMed) is recommended as needed and prior to medicated nasal sprays.  Coughing Monitor symptoms.  Normal spirometry today. If you have issues with the coughing - make appointment for acute visit evaluation.  May use Airsupra  rescue inhaler 2 puffs every 4 to 6 hours as needed for shortness of breath, chest tightness, coughing, and wheezing. Do not use more than 12 puffs in 24 hours. May use Airsupra  rescue inhaler 2 puffs 5 to 15 minutes prior to strenuous physical activities. Rinse mouth after each use. Coupon given.  Monitor frequency of use - if you need to use it more than twice per week on a consistent basis let us  know.   Follow up in 12 months or sooner if needed.  If you have issues with the medications refills let us  know.

## 2023-05-24 ENCOUNTER — Other Ambulatory Visit (HOSPITAL_COMMUNITY): Payer: Self-pay

## 2023-05-24 ENCOUNTER — Telehealth: Payer: Self-pay

## 2023-05-24 NOTE — Telephone Encounter (Signed)
 Pharmacy Patient Advocate Encounter   Received notification from CoverMyMeds that prior authorization for Azelastine -Fluticasone  137-50MCG/ACT suspension is required/requested.   Insurance verification completed.   The patient is insured through [employer] .   Prior Authorization for Azelastine -Fluticasone  137-50MCG/ACT suspension has been APPROVED from 05-24-2023 to 05-22-2024. Ran test claim, Copay is $117.87**. This test claim was processed through [facility]- copay amounts may vary at other pharmacies due to pharmacy/plan contracts, or as the patient moves through the different stages of their insurance plan.  ** Patient has deductible to meet.   PA #/Case ID/Reference #: [id number]

## 2023-05-29 ENCOUNTER — Ambulatory Visit (HOSPITAL_BASED_OUTPATIENT_CLINIC_OR_DEPARTMENT_OTHER)
Admission: RE | Admit: 2023-05-29 | Discharge: 2023-05-29 | Disposition: A | Discharge status: Home / Self Care | Payer: Managed Care, Other (non HMO) | Source: Ambulatory Visit | Attending: Orthopedic Surgery | Admitting: Orthopedic Surgery

## 2023-05-29 DIAGNOSIS — R5382 Chronic fatigue, unspecified: Secondary | ICD-10-CM | POA: Insufficient documentation

## 2023-07-03 ENCOUNTER — Other Ambulatory Visit: Payer: Self-pay | Admitting: Allergy

## 2023-08-22 ENCOUNTER — Telehealth: Payer: Self-pay | Admitting: *Deleted

## 2023-08-22 NOTE — Telephone Encounter (Signed)
 Copied from CRM [id number]. Topic: Clinical - Medication Question >> Aug 22, 2023  4:06 PM Fredrich Romans wrote: Reason for CRM: Patient would like to know if he could have [place] and a zpak prescribed for him for his [place] trip that he will be leaving for on Friday 08/25/2023? He stated that provider prescribed for him last year for the same reason.

## 2023-08-23 NOTE — Telephone Encounter (Signed)
 He needs appointment. Last seen over a year ago.  Katina Degree. Jimmey Ralph, MD 08/23/2023 9:43 AM

## 2023-08-23 NOTE — Telephone Encounter (Signed)
 Patient transferred to front office to schedule. Patient questioned if he scheduled appointment for a few weeks out, could he get medication sent in because he is leaving on Friday. Please advise.

## 2023-08-23 NOTE — Telephone Encounter (Signed)
 Please schedule an office visit for medication refills

## 2023-08-24 ENCOUNTER — Encounter: Payer: Self-pay | Admitting: Family Medicine

## 2023-08-24 ENCOUNTER — Ambulatory Visit: Admitting: Family Medicine

## 2023-08-24 VITALS — BP 126/76 | HR 76 | Temp 98.1°F | Ht 71.0 in | Wt 210.4 lb

## 2023-08-24 DIAGNOSIS — Z8042 Family history of malignant neoplasm of prostate: Secondary | ICD-10-CM

## 2023-08-24 DIAGNOSIS — E785 Hyperlipidemia, unspecified: Secondary | ICD-10-CM | POA: Diagnosis not present

## 2023-08-24 DIAGNOSIS — J3089 Other allergic rhinitis: Secondary | ICD-10-CM

## 2023-08-24 DIAGNOSIS — G4725 Circadian rhythm sleep disorder, jet lag type: Secondary | ICD-10-CM | POA: Diagnosis not present

## 2023-08-24 DIAGNOSIS — J45909 Unspecified asthma, uncomplicated: Secondary | ICD-10-CM

## 2023-08-24 MED ORDER — AZITHROMYCIN 500 MG PO TABS: 500.0000 mg | ORAL_TABLET | Freq: Every day | ORAL | 1 refills | Status: DC

## 2023-08-24 MED ORDER — ATOVAQUONE-PROGUANIL HCL 62.5-25 MG PO TABS: 1.0000 | ORAL_TABLET | Freq: Every day | ORAL | 1 refills | Status: AC

## 2023-08-24 MED ORDER — ZOLPIDEM TARTRATE 10 MG PO TABS: 10.0000 mg | ORAL_TABLET | Freq: Every evening | ORAL | 1 refills | Status: AC | PRN

## 2023-08-24 NOTE — Telephone Encounter (Signed)
 Patient has an office visit with PCP today

## 2023-08-24 NOTE — Assessment & Plan Note (Signed)
 Gets PSA checked through work.

## 2023-08-24 NOTE — Assessment & Plan Note (Signed)
 Has lipids checked to be at work.  He is working on lifestyle interventions.

## 2023-08-24 NOTE — Telephone Encounter (Signed)
 Needs an appointment since it has been over a year. It is ok to schedule as virtual for today or tomorrow.  Katina Degree. Jimmey Ralph, MD 08/24/2023 8:01 AM

## 2023-08-24 NOTE — Assessment & Plan Note (Signed)
 Overall stable.  Follows with allergist.  He is on Xyzal and Singulair as well as Dymistax.

## 2023-08-24 NOTE — Progress Notes (Signed)
   Miguel Jordan is a 48 y.o. male who presents today for an office visit.  Assessment/Plan:  New/Acute Problems: Encounter for travel advice He is up-to-date on vaccines.  Will send prescription in for Ambien to use as needed for jet lag.  He did well with this last year without any significant side effects.  Will also send pocket prescription for azithromycin for traveler's diarrhea.  He did not need to use this last year.  Will also start malaria prophylaxis with Malarone.  Did well with this last year.  He is aware of potential side effects.  Chronic Problems Addressed Today: Allergic rhinitis Overall stable.  Follows with allergist.  He is on Xyzal and Singulair as well as Dymistax.  Family history of prostate cancer Gets PSA checked through work.   Dyslipidemia Has lipids checked to be at work.  He is working on lifestyle interventions.  Asthma due to seasonal allergies Continue management per allergist.  He is currently on Airsupra, Xyzal, and Singulair.  Jet lag syndrome Will give small supply of Ambien.  He has upcoming trip to [place] and then to [place].  Usually does well with Ambien.     Subjective:  HPI:  See A/P for status of chronic conditions.  Patient is here for follow-up.  Last seen about a year ago.  He is here today for medication refills. He would like to have a refill for Ambien, azithromycin, and Malarone.  He will be going to [place] starting tomorrow.  He had these prescriptions sent in a year ago for similar trip and did well with these.  Tolerating well without side effects.       Objective:  Physical Exam: BP 126/76   Pulse 76   Temp 98.1 F (36.7 C) (Temporal)   Ht 5\' 11"  (1.803 m)   Wt 210 lb 6.4 oz (95.4 kg)   SpO2 95%   BMI 29.34 kg/m   Gen: No acute distress, resting comfortably CV: Regular rate and rhythm with no murmurs appreciated Pulm: Normal work of breathing, clear to auscultation bilaterally with no crackles, wheezes, or  rhonchi Neuro: Grossly normal, moves all extremities Psych: Normal affect and thought content      Jerzey Komperda M. Jimmey Ralph, MD 08/24/2023 2:32 PM

## 2023-08-24 NOTE — Assessment & Plan Note (Signed)
 Continue management per allergist.  He is currently on Airsupra, Xyzal, and Singulair.

## 2023-08-24 NOTE — Patient Instructions (Signed)
 It was very nice to see you today!  Will send a prescription in for Ambien, azithromycin, and Malarone.  Please let me know what you would like to do for colon cancer screening.  Let us know if you need referral for colonoscopy or if you would like to do the Cologuard.  Will see you back in a year.  Come back sooner if needed.  Return in about 1 year (around 08/23/2024) for Annual Physical.   Take care, Dr Jimmey Ralph  PLEASE NOTE:  If you had any lab tests, please let us know if you have not heard back within a few days. You may see your results on mychart before we have a chance to review them but we will give you a call once they are reviewed by [place].   If we ordered any referrals today, please let us know if you have not heard from their office within the next week.   If you had any urgent prescriptions sent in today, please check with the pharmacy within an hour of our visit to make sure the prescription was transmitted appropriately.   Please try these tips to maintain a healthy lifestyle:  Eat at least 3 REAL meals and 1-2 snacks per day.  Aim for no more than 5 hours between eating.  If you eat breakfast, please do so within one hour of getting up.   Each meal should contain half fruits/vegetables, one quarter protein, and one quarter carbs (no bigger than a computer mouse)  Cut down on sweet beverages. This includes juice, soda, and sweet tea.   Drink at least 1 glass of water with each meal and aim for at least 8 glasses per day  Exercise at least 150 minutes every week.

## 2023-08-24 NOTE — Assessment & Plan Note (Signed)
 Will give small supply of Ambien.  He has upcoming trip to [place] and then to [place].  Usually does well with Ambien.

## 2023-10-31 ENCOUNTER — Other Ambulatory Visit: Payer: Self-pay | Admitting: Family Medicine

## 2023-10-31 NOTE — Telephone Encounter (Signed)
 Copied from CRM [id number]. Topic: Clinical - Medication Refill >> Oct 31, 2023  5:12 PM [place] J wrote: Medication: azithromycin  (ZITHROMAX ) 500 MG tablet  Has the patient contacted their pharmacy? No (Agent: If no, request that the patient contact the pharmacy for the refill. If patient does not wish to contact the pharmacy document the reason why and proceed with request.) (Agent: If yes, when and what did the pharmacy advise?) No refills at the pharmacy. This is the patient's preferred pharmacy:   [id number] - [place], [place] - [address] [place] [address] Phone: [phone] Fax: [phone]  Is this the correct pharmacy for this prescription? Yes If no, delete pharmacy and type the correct one.   Has the prescription been filled recently? No  Is the patient out of the medication? Yes  Has the patient been seen for an appointment in the last year OR does the patient have an upcoming appointment? Yes  Can we respond through MyChart? No  Agent: Please be advised that Rx refills may take up to 3 business days. We ask that you follow-up with your pharmacy.

## 2023-11-01 MED ORDER — AZITHROMYCIN 500 MG PO TABS: 500.0000 mg | ORAL_TABLET | Freq: Every day | ORAL | 1 refills | Status: AC
